# Patient Record
Sex: Female | Born: 1970 | Race: White | Hispanic: No | Marital: Single | Smoking: Never smoker
Health system: Southern US, Community
[De-identification: ages and names within clinical notes are randomized; demographics above are authoritative.]

## PROBLEM LIST (undated history)

## (undated) DIAGNOSIS — I10 Essential (primary) hypertension: Secondary | ICD-10-CM

## (undated) DIAGNOSIS — T148XXA Other injury of unspecified body region, initial encounter: Secondary | ICD-10-CM

## (undated) DIAGNOSIS — E785 Hyperlipidemia, unspecified: Secondary | ICD-10-CM

## (undated) HISTORY — DX: Hyperlipidemia, unspecified: E78.5

## (undated) HISTORY — PX: OTHER SURGICAL HISTORY: SHX169

## (undated) HISTORY — DX: Essential (primary) hypertension: I10

## (undated) HISTORY — DX: Other injury of unspecified body region, initial encounter: T14.8XXA

---

## 2006-01-18 ENCOUNTER — Ambulatory Visit: Payer: Self-pay | Admitting: Internal Medicine

## 2006-01-18 LAB — CONVERTED CEMR LAB
ALT: 14 units/L
Basophils Absolute: 0 10*3/uL
CO2: 20 meq/L
Calcium: 8.4 mg/dL
Chloride: 105 meq/L
Cholesterol: 228 mg/dL
Creatinine, Ser: 0.6 mg/dL
Hemoglobin: 14.1 g/dL
Lymphocytes Relative: 28 %
Monocytes Absolute: 0.3 10*3/uL
Neutro Abs: 3.3 10*3/uL
Pap Smear: NORMAL
RBC: 4.82 M/uL
RDW: 12.6 %
Total Protein: 7.4 g/dL
WBC: 5.1 10*3/uL

## 2007-02-08 ENCOUNTER — Encounter: Payer: Self-pay | Admitting: Internal Medicine

## 2007-02-15 ENCOUNTER — Ambulatory Visit: Payer: Self-pay | Admitting: Internal Medicine

## 2007-02-19 ENCOUNTER — Encounter (INDEPENDENT_AMBULATORY_CARE_PROVIDER_SITE_OTHER): Payer: Self-pay | Admitting: Internal Medicine

## 2009-05-19 ENCOUNTER — Ambulatory Visit: Payer: Self-pay | Admitting: Family Medicine

## 2009-05-19 DIAGNOSIS — E785 Hyperlipidemia, unspecified: Secondary | ICD-10-CM | POA: Insufficient documentation

## 2009-05-19 DIAGNOSIS — I10 Essential (primary) hypertension: Secondary | ICD-10-CM | POA: Insufficient documentation

## 2009-05-19 DIAGNOSIS — E669 Obesity, unspecified: Secondary | ICD-10-CM | POA: Insufficient documentation

## 2009-05-22 ENCOUNTER — Encounter: Payer: Self-pay | Admitting: Family Medicine

## 2009-05-25 ENCOUNTER — Encounter: Payer: Self-pay | Admitting: Family Medicine

## 2009-05-25 LAB — CONVERTED CEMR LAB: Hgb A1c MFr Bld: 5.2 % (ref 4.6–6.1)

## 2009-05-26 LAB — CONVERTED CEMR LAB
Basophils Relative: 0 % (ref 0–1)
CO2: 23 meq/L (ref 19–32)
Calcium: 10.1 mg/dL (ref 8.4–10.5)
Creatinine, Ser: 0.68 mg/dL (ref 0.40–1.20)
Eosinophils Absolute: 0.2 10*3/uL (ref 0.0–0.7)
Glucose, Bld: 107 mg/dL — ABNORMAL HIGH (ref 70–99)
HCT: 44.3 % (ref 36.0–46.0)
Hemoglobin: 14.4 g/dL (ref 12.0–15.0)
LDL Cholesterol: 125 mg/dL — ABNORMAL HIGH (ref 0–99)
MCHC: 32.5 g/dL (ref 30.0–36.0)
MCV: 90.8 fL (ref 78.0–100.0)
Monocytes Absolute: 0.4 10*3/uL (ref 0.1–1.0)
Monocytes Relative: 8 % (ref 3–12)
RBC: 4.88 M/uL (ref 3.87–5.11)
TSH: 1.641 microintl units/mL (ref 0.350–4.500)
Total CHOL/HDL Ratio: 3.1

## 2009-05-29 ENCOUNTER — Encounter: Admission: RE | Admit: 2009-05-29 | Discharge: 2009-05-29 | Payer: Self-pay | Admitting: Family Medicine

## 2009-06-05 ENCOUNTER — Encounter: Admission: RE | Admit: 2009-06-05 | Discharge: 2009-06-05 | Payer: Self-pay | Admitting: Family Medicine

## 2009-07-23 ENCOUNTER — Encounter: Payer: Self-pay | Admitting: Family Medicine

## 2009-07-24 ENCOUNTER — Ambulatory Visit: Payer: Self-pay | Admitting: Family Medicine

## 2009-07-24 ENCOUNTER — Encounter: Payer: Self-pay | Admitting: Family Medicine

## 2009-07-24 ENCOUNTER — Other Ambulatory Visit: Admission: RE | Admit: 2009-07-24 | Discharge: 2009-07-24 | Payer: Self-pay | Admitting: Family Medicine

## 2009-08-11 ENCOUNTER — Encounter: Payer: Self-pay | Admitting: Family Medicine

## 2009-11-19 DIAGNOSIS — R7301 Impaired fasting glucose: Secondary | ICD-10-CM | POA: Insufficient documentation

## 2009-11-19 DIAGNOSIS — R310 Gross hematuria: Secondary | ICD-10-CM | POA: Insufficient documentation

## 2009-11-19 LAB — CONVERTED CEMR LAB
Chloride: 102 meq/L (ref 96–112)
Cholesterol: 192 mg/dL (ref 0–200)
Creatinine, Ser: 0.76 mg/dL (ref 0.40–1.20)
HDL: 54 mg/dL (ref >39)
LDL Cholesterol: 122 mg/dL — ABNORMAL HIGH (ref 0–99)
Potassium: 4.1 meq/L (ref 3.5–5.3)
Triglycerides: 79 mg/dL (ref <150)

## 2009-11-27 ENCOUNTER — Ambulatory Visit: Payer: Self-pay | Admitting: Family Medicine

## 2010-02-25 LAB — CONVERTED CEMR LAB
BUN: 16 mg/dL (ref 6–23)
Chloride: 102 meq/L (ref 96–112)
Creatinine, Ser: 0.77 mg/dL (ref 0.40–1.20)
Glucose, Bld: 98 mg/dL (ref 70–99)
Hgb A1c MFr Bld: 5.5 % (ref <5.7)
LDL Cholesterol: 134 mg/dL — ABNORMAL HIGH (ref 0–99)
Triglycerides: 110 mg/dL (ref <150)
VLDL: 22 mg/dL (ref 0–40)

## 2010-03-03 ENCOUNTER — Ambulatory Visit: Payer: Self-pay | Admitting: Family Medicine

## 2010-03-03 DIAGNOSIS — R5381 Other malaise: Secondary | ICD-10-CM | POA: Insufficient documentation

## 2010-03-03 DIAGNOSIS — R5383 Other fatigue: Secondary | ICD-10-CM

## 2010-03-03 DIAGNOSIS — E559 Vitamin D deficiency, unspecified: Secondary | ICD-10-CM | POA: Insufficient documentation

## 2010-03-04 DIAGNOSIS — R9431 Abnormal electrocardiogram [ECG] [EKG]: Secondary | ICD-10-CM

## 2010-03-04 HISTORY — DX: Abnormal electrocardiogram (ECG) (EKG): R94.31

## 2010-03-05 ENCOUNTER — Encounter: Payer: Self-pay | Admitting: Family Medicine

## 2010-03-09 ENCOUNTER — Ambulatory Visit: Payer: Self-pay | Admitting: Cardiology

## 2010-03-09 ENCOUNTER — Ambulatory Visit (HOSPITAL_COMMUNITY): Admission: RE | Admit: 2010-03-09 | Discharge: 2010-03-09 | Payer: Self-pay | Admitting: Family Medicine

## 2010-03-11 ENCOUNTER — Telehealth (INDEPENDENT_AMBULATORY_CARE_PROVIDER_SITE_OTHER): Payer: Self-pay | Admitting: *Deleted

## 2010-03-16 ENCOUNTER — Telehealth: Payer: Self-pay | Admitting: Family Medicine

## 2010-03-30 ENCOUNTER — Encounter: Payer: Self-pay | Admitting: Family Medicine

## 2010-04-05 ENCOUNTER — Telehealth: Payer: Self-pay | Admitting: Family Medicine

## 2010-06-07 ENCOUNTER — Encounter: Admission: RE | Admit: 2010-06-07 | Discharge: 2010-06-07 | Payer: Self-pay | Admitting: Family Medicine

## 2010-07-23 LAB — CONVERTED CEMR LAB
BUN: 10 mg/dL (ref 6–23)
CO2: 25 meq/L (ref 19–32)
Chloride: 103 meq/L (ref 96–112)
Creatinine, Ser: 0.64 mg/dL (ref 0.40–1.20)
Hemoglobin: 13.9 g/dL (ref 12.0–15.0)
LDL Cholesterol: 140 mg/dL — ABNORMAL HIGH (ref 0–99)
Lymphocytes Relative: 27 % (ref 12–46)
Lymphs Abs: 1.2 10*3/uL (ref 0.7–4.0)
Monocytes Absolute: 0.4 10*3/uL (ref 0.1–1.0)
Monocytes Relative: 8 % (ref 3–12)
Neutro Abs: 2.8 10*3/uL (ref 1.7–7.7)
Neutrophils Relative %: 63 % (ref 43–77)
Potassium: 4 meq/L (ref 3.5–5.3)
RBC: 4.51 M/uL (ref 3.87–5.11)
TSH: 1.98 microintl units/mL (ref 0.350–4.500)
VLDL: 23 mg/dL (ref 0–40)
Vit D, 25-Hydroxy: 41 ng/mL (ref 30–89)
WBC: 4.5 10*3/uL (ref 4.0–10.5)

## 2010-07-27 ENCOUNTER — Ambulatory Visit: Payer: Self-pay | Admitting: Family Medicine

## 2010-07-27 ENCOUNTER — Other Ambulatory Visit: Admission: RE | Admit: 2010-07-27 | Discharge: 2010-07-27 | Payer: Self-pay | Admitting: Family Medicine

## 2010-07-29 ENCOUNTER — Encounter: Payer: Self-pay | Admitting: Family Medicine

## 2010-07-29 LAB — CONVERTED CEMR LAB: Pap Smear: NEGATIVE

## 2010-10-12 NOTE — Letter (Signed)
Summary: Pap Smear, Normal Letter, [facility]  [address]   [place], [place] [address]   Phone: [phone]  Fax: [phone]          July 29, 2010    Dear: [name]    I am pleased to notify you that your PAP smear was normal.  You will need your next PAP smear in:     ____ 3 Months    ____ 6 Months    ____ 12 Months    Please call the office at our office number above, to schedule your next appointment.    Sincerely,    Adella Hare, LPN [facility]

## 2010-10-12 NOTE — Letter (Signed)
Summary: CHRONIC DISEASE REFERRAL  CHRONIC DISEASE REFERRAL   Imported By: Lind Guest 03/05/2010 10:28:04  _____________________________________________________________________  External Attachment:    Type:   Image     Comment:   External Document

## 2010-10-12 NOTE — Progress Notes (Signed)
  Phone Note Call from Patient   Summary of Call: What is the reason for the [facility] nutrition referral? I told them it was to lose weight but they wanted to know if she was having problems with her sugar or had she "gained" weight recently and couldn't get it off? I told her no, but I was assuming you were just wanting it to help her lose weight but she needs a specific diagnosis code to enter.   [facility] [phone] ext [phone] Initial call taken by: Everitt Amber LPN,  April 05, 2010 4:34 PM  Follow-up for Phone Call        overweight  is the dx , to assist with weight loss, maybe one visit only enough, see if pt really wants to go/thinksit necessary before pursuing, offer her a 1200 cal diet sheet also pls Follow-up by: Syliva Overman MD,  April 05, 2010 5:07 PM  Additional Follow-up for Phone Call Additional follow up Details #1::        called patient no answer. Faxed the ICD-9 code to the nutritionist at [facility] Additional Follow-up by: Everitt Amber LPN,  April 06, 2010 4:27 PM

## 2010-10-12 NOTE — Progress Notes (Signed)
  Phone Note Other Incoming   Caller: dr simpson Summary of Call: pls call Carrie Mcintyre and let her know that her test shows no sign of blockage in her heart, It only shows mild enlrgement of her left heart chamber that pumps blood to her body (the ventricle), and this is because she has hypertension. Since it is controlled her heart should not get more eblarged, pls reassure her the study report ois good, continue regular execise with reduced carb /calorie diet to facilitate weight l;oss and improved health Initial call taken by: Syliva Overman MD,  March 16, 2010 8:28 PM  Follow-up for Phone Call        patient aware Follow-up by: Adella Hare LPN,  March 17, 2129 11:25 AM

## 2010-10-12 NOTE — Assessment & Plan Note (Signed)
Summary: office visit   Vital Signs:  Patient profile:   40 year old female Menstrual status:  regular Height:      64.5 inches Weight:      171 pounds BMI:     29.00 O2 Sat:      95 % Pulse rate:   90 / minute Pulse rhythm:   regular Resp:     16 per minute BP sitting:   112 / 84  (left arm) Cuff size:   regular  Vitals Entered By: Everitt Amber LPN (March 03, 2010 8:11 AM)  Nutrition Counseling: Patient's BMI is greater than 25 and therefore counseled on weight management options. CC: Follow up chronic problems   CC:  Follow up chronic problems.  History of Present Illness: Reports  that she has been  doing well.She has totally "bought into" the exercise routine, however her diet is unchanged and unfortunately her weight is essentially unchanged. Denies recent fever or chills. Denies sinus pressure, nasal congestion , ear pain or sore throat. Denies chest congestion, or cough productive of sputum. Denies chest pain, palpitations, PND, orthopnea or leg swelling.She nhas some concerns about the EKG which she had done late last year, on review, there is a question of an old infarct, though she is asy,mptomatic, her mother has peripheral vascular ds, afdter a "curbside consult " with cardiology, she will have an echocardiogram. Denies abdominal pain, nausea, vomitting, diarrhea or constipation. Denies change in bowel movements or bloody stool. Denies dysuria , frequency, incontinence or hesitancy. Denies  joint pain, swelling, or reduced mobility. Denies headaches, vertigo, seizures. Denies depression, anxiety or insomnia. Denies  rash, lesions, or itch.     Current Medications (verified): 1)  Maxzide-25 37.5-25 Mg Tabs (Triamterene-Hctz) .... One and A Half Tablets Daily  Allergies (verified): No Known Drug Allergies  Review of Systems      See HPI Eyes:  Denies blurring and discharge. CV:  Denies chest pain or discomfort, difficulty breathing while lying down, and  swelling of feet. Psych:  Denies anxiety and depression. Endo:  Denies cold intolerance, excessive hunger, excessive thirst, excessive urination, heat intolerance, polyuria, and weight change. Heme:  Denies abnormal bruising and bleeding. Allergy:  Denies hives or rash and itching eyes.  Physical Exam  General:  Well-developed,well-nourished,in no acute distress; alert,appropriate and cooperative throughout examination HEENT: No facial asymmetry,  EOMI, No sinus tenderness, TM's Clear, oropharynx  pink and moist.   Chest: Clear to auscultation bilaterally.  CVS: S1, S2, No murmurs, No S3.   Abd: Soft, Nontender.  MS: Adequate ROM spine, hips, shoulders and knees.  Ext: No edema.   CNS: CN 2-12 intact, power tone and sensation normal throughout.   Skin: Intact, no visible lesions or rashes.  Psych: Good eye contact, normal affect.  Memory intact, not anxious or depressed appearing.    Impression & Recommendations:  Problem # 1:  ABNORMAL ELECTROCARDIOGRAM (ICD-794.31) Assessment Comment Only  Future Orders: Cardiology Referral (Cardiology) ... 03/04/2010  Problem # 2:  HYPERLIPIDEMIA (ICD-272.4) Assessment: Deteriorated  Orders: T-Lipid Profile [id number])  Labs Reviewed: SGOT: 14 (01/18/2006)   SGPT: 14 (01/18/2006)  Prior 10 Yr Risk Heart Disease: 2 % (07/24/2009)   HDL:59 (02/24/2010), 54 (11/18/2009)  LDL:134 (02/24/2010), 122 (11/18/2009)  Chol:215 (02/24/2010), 192 (11/18/2009)  Trig:110 (02/24/2010), 79 (11/18/2009), pt to aggressively work on dietary changes, no meds at this time  Problem # 3:  HYPERTENSION (ICD-401.9) Assessment: Unchanged  Her updated medication list for this problem includes:    Maxzide-25  37.5-25 Mg Tabs (Triamterene-hctz) ..... One and a half tablets daily  Orders: T-Basic Metabolic Panel [id number])  BP today: 112/84 Prior BP: 112/80 (11/27/2009)  Prior 10 Yr Risk Heart Disease: 2 % (07/24/2009)  Labs Reviewed: K+: 3.8  (02/24/2010) Creat: : 0.77 (02/24/2010)   Chol: 215 (02/24/2010)   HDL: 59 (02/24/2010)   LDL: 134 (02/24/2010)   TG: 110 (02/24/2010)  Complete Medication List: 1)  Maxzide-25 37.5-25 Mg Tabs (Triamterene-hctz) .... One and a half tablets daily  Other Orders: T- Hemoglobin A1C ([id number]) T-CBC w/Diff [id number]) T-TSH [id number]) T-Vitamin D (25-Hydroxy) [id number])  Patient Instructions: 1)  Please schedule a CPE november 13 or after 2)  Lipid Panel prior to visit, ICD-9: 3)  HbgA1C prior to visit, ICD-9: 4)  BMP prior to visit, ICD-9: 5)  TSH prior to visit, ICD-9:   fasting in November  6)  CBC w/ Diff prior to visit, ICD-9: 7)  Vit D 8)  It is important that you exercise regularly at least 60 minutes 5 times a week. If you develop chest pain, have severe difficulty breathing, or feel very tired , stop exercising immediately and seek medical attention.Great keep it up 9)  You need to lose weight. Consider a lower calorie diet and regular exercise. Pls focus onn changing your diet, pls keep appt wiith a nutritionist

## 2010-10-12 NOTE — Progress Notes (Signed)
Summary: NUTRITION THERAPHY  NUTRITION THERAPHY   Imported By: Lind Guest 04/06/2010 14:22:45  _____________________________________________________________________  External Attachment:    Type:   Image     Comment:   External Document

## 2010-10-12 NOTE — Assessment & Plan Note (Signed)
Summary: physical   Vital Signs:  Patient profile:   40 year old female Menstrual status:  regular Height:      64.5 inches Weight:      166 pounds BMI:     28.16 O2 Sat:      98 % on Room air Pulse rate:   86 / minute Pulse rhythm:   regular Resp:     16 per minute BP sitting:   122 / 90  (left arm)  Vitals Entered By: Mauricia Area CMA (July 27, 2010 8:14 AM)  Nutrition Counseling: Patient's BMI is greater than 25 and therefore counseled on weight management options.  O2 Flow:  Room air CC: physical  Vision Screening:Left eye w/o correction: 20 / 15 Right Eye w/o correction: 20 / 25 Both eyes w/o correction:  20/ 13        Vision Entered By: Mauricia Area CMA (July 27, 2010 8:23 AM)   Primary Care Provider:  Syliva Overman MD  CC:  physical.  History of Present Illness: Reports  that she has been  doing well. Denies recent fever or chills. Denies sinus pressure, nasal congestion , ear pain or sore throat. Denies chest congestion, or cough productive of sputum. Denies chest pain, palpitations, PND, orthopnea or leg swelling. Denies abdominal pain, nausea, vomitting, diarrhea or constipation. Denies change in bowel movements or bloody stool. Denies dysuria , frequency, incontinence or hesitancy. Denies  joint pain, swelling, or reduced mobility. Denies headaches, vertigo, seizures. Denies depression, anxiety or insomnia. Denies  rash, lesions, or itch.     Current Medications (verified): 1)  Maxzide-25 37.5-25 Mg Tabs (Triamterene-Hctz) .... One and A Half Tablets Daily  Allergies (verified): No Known Drug Allergies  Review of Systems      See HPI General:  Complains of fatigue. Eyes:  Denies blurring, discharge, eye pain, and red eye. Endo:  Denies cold intolerance, excessive hunger, excessive thirst, and excessive urination. Heme:  Denies abnormal bruising and bleeding. Allergy:  Denies hives or rash and itching eyes.  Physical  Exam  General:  Well-developed,well-nourished,in no acute distress; alert,appropriate and cooperative throughout examination Head:  Normocephalic and atraumatic without obvious abnormalities. No apparent alopecia or balding. Eyes:  No corneal or conjunctival inflammation noted. EOMI. Perrla. Funduscopic exam benign, without hemorrhages, exudates or papilledema. Vision grossly normal. Ears:  External ear exam shows no significant lesions or deformities.  Otoscopic examination reveals clear canals, tympanic membranes are intact bilaterally without bulging, retraction, inflammation or discharge. Hearing is grossly normal bilaterally. Nose:  External nasal examination shows no deformity or inflammation. Nasal mucosa are pink and moist without lesions or exudates. Mouth:  Oral mucosa and oropharynx without lesions or exudates.  Teeth in good repair. Neck:  No deformities, masses, or tenderness noted. Chest Wall:  No deformities, masses, or tenderness noted. Breasts:  No mass, nodules, thickening, tenderness, bulging, retraction, inflamation, nipple discharge or skin changes noted.   Lungs:  Normal respiratory effort, chest expands symmetrically. Lungs are clear to auscultation, no crackles or wheezes. Heart:  Normal rate and regular rhythm. S1 and S2 normal without gallop, murmur, click, rub or other extra sounds. Abdomen:  Bowel sounds positive,abdomen soft and non-tender without masses, organomegaly or hernias noted. Rectal:  No external abnormalities noted. Normal sphincter tone. No rectal masses or tenderness. Genitalia:  Normal introitus for age, no external lesions, no vaginal discharge, mucosa pink and moist, no vaginal or cervical lesions, no vaginal atrophy, no friaility or hemorrhage, normal uterus size and position, no  adnexal masses or tenderness Msk:  No deformity or scoliosis noted of thoracic or lumbar spine.   Pulses:  R and L carotid,radial,femoral,dorsalis pedis and posterior tibial  pulses are full and equal bilaterally Extremities:  No clubbing, cyanosis, edema, or deformity noted with normal full range of motion of all joints.   Neurologic:  No cranial nerve deficits noted. Station and gait are normal. Plantar reflexes are down-going bilaterally. DTRs are symmetrical throughout. Sensory, motor and coordinative functions appear intact. Skin:  Intact without suspicious lesions or rashes Cervical Nodes:  No lymphadenopathy noted Axillary Nodes:  No palpable lymphadenopathy Inguinal Nodes:  No significant adenopathy Psych:  Cognition and judgment appear intact. Alert and cooperative with normal attention span and concentration. No apparent delusions, illusions, hallucinations   Impression & Recommendations:  Problem # 1:  OVERWEIGHT (ICD-278.02) Assessment Unchanged  Ht: 64.5 (07/27/2010)   Wt: 166 (07/27/2010)   BMI: 28.16 (07/27/2010) therapeutic lifestyle change discussed and encouraged  Problem # 2:  HYPERTENSION (ICD-401.9) Assessment: Deteriorated  The following medications were removed from the medication list:    Maxzide-25 37.5-25 Mg Tabs (Triamterene-hctz) ..... One and a half tablets daily Her updated medication list for this problem includes:    Maxzide-25 37.5-25 Mg Tabs (Triamterene-hctz) .Marland Kitchen... Take 1 tablet by mouth once a day  Orders: T-Basic Metabolic Panel [id number])  BP today: 122/90 Prior BP: 112/84 (03/03/2010)  Prior 10 Yr Risk Heart Disease: 2 % (07/24/2009)  Labs Reviewed: K+: 4.0 (07/21/2010) Creat: : 0.64 (07/21/2010)   Chol: 233 (07/21/2010)   HDL: 70 (07/21/2010)   LDL: 140 (07/21/2010)   TG: 114 (07/21/2010)  Problem # 3:  HYPERLIPIDEMIA (ICD-272.4) Assessment: Deteriorated  Orders: T-Lipid Profile [id number])  Labs Reviewed: SGOT: 14 (01/18/2006)   SGPT: 14 (01/18/2006)  Prior 10 Yr Risk Heart Disease: 2 % (07/24/2009)   HDL:70 (07/21/2010), 59 (02/24/2010)  LDL:140 (07/21/2010), 134 (02/24/2010)  Chol:233  (07/21/2010), 215 (02/24/2010)  Trig:114 (07/21/2010), 110 (02/24/2010) Low fat dietdiscussed and encouraged  Complete Medication List: 1)  Maxzide-25 37.5-25 Mg Tabs (Triamterene-hctz) .... Take 1 tablet by mouth once a day  Other Orders: T- Hemoglobin A1C ([id number]) Influenza Vaccine NON MCR (57846) Pap Smear (96295)  Patient Instructions: 1)  Follow up appointment in 5.18months 2)  hBA1c and fasting chem 7 and lipid in 4 months 3)  It is important that you exercise regularly at least 20 minutes 5 times a week. If you develop chest pain, have severe difficulty breathing, or feel very tired , stop exercising immediately and seek medical attention. 4)  You need to lose weight. Consider a lower calorie diet and regular exercise.  5)  pls eat more white meat, less cheese and fatty foods 6)  flu vac today Prescriptions: MAXZIDE-25 37.5-25 MG TABS (TRIAMTERENE-HCTZ) Take 1 tablet by mouth once a day  #135 x 3   Entered and Authorized by:   Syliva Overman MD   Signed by:   Syliva Overman MD on 07/27/2010   Method used:   Printed then faxed to ...       [facility]  [address] [phone]* (retail)       [address]       [place], [place]  [address]       Ph: [phone] or [phone]       Fax: [phone]   RxID:   [id number]    Orders Added: 1)  Est. Patient Level IV [10932] 2)  T-Basic Metabolic Panel [id number] 3)  T-Lipid Profile [[id number]] 4)  T- Hemoglobin A1C [[id number]] 5)  Influenza Vaccine NON MCR [00028] 6)  Pap Smear [41324]   Immunizations Administered:  Influenza Vaccine # 1:    Vaccine Type: Fluvax Non-MCR    Site: left deltoid    Mfr: novartis    Dose: 0.5 ml    Route: IM    Given by: Adella Hare LPN    Exp. Date: 01/2011    Lot #: [id number]    VIS given: 04/06/10 version given July 27, 2010.   Immunizations Administered:  Influenza Vaccine # 1:    Vaccine Type: Fluvax Non-MCR    Site: left deltoid    Mfr: novartis     Dose: 0.5 ml    Route: IM    Given by: Adella Hare LPN    Exp. Date: 01/2011    Lot #: [id number]    VIS given: 04/06/10 version given July 27, 2010.

## 2010-10-12 NOTE — Progress Notes (Signed)
Summary: TEST RESULTS  Phone Note Call from Patient   Summary of Call: WANTS TEST RESULTS   CALL  BACK AT   [phone] Initial call taken by: Lind Guest,  March 11, 2010 4:11 PM  Follow-up for Phone Call        let her know we are waitingon word from dr Dietrich Pates pls Follow-up by: Syliva Overman MD,  March 11, 2010 4:55 PM  Additional Follow-up for Phone Call Additional follow up Details #1::        returned call, no answer Additional Follow-up by: Adella Hare LPN,  March 13, 4539 11:06 AM    Additional Follow-up for Phone Call Additional follow up Details #2::    Patient aware but really anxious to know something since its the long holiday weekend Follow-up by: Everitt Amber LPN,  March 12, 9810 1:28 PM

## 2010-10-12 NOTE — Assessment & Plan Note (Signed)
Summary: follow up- room 1   Vital Signs:  Patient profile:   40 year old female Menstrual status:  regular Height:      64.5 inches Weight:      173.75 pounds BMI:     29.47 O2 Sat:      98 % on Room air Pulse rate:   103 / minute Resp:     16 per minute BP sitting:   112 / 80  (left arm)  Vitals Entered By: Adella Hare LPN (November 27, 2009 8:15 AM) CC: follow-up visit Is Patient Diabetic? No Pain Assessment Patient in pain? no        CC:  follow-up visit.  History of Present Illness: Pt is here today for f/u of her HTN and review recent labs. She has been exercising regularly.  walks 1miles/wk & is feeling better overall.  Also has been making dietary changes to try to help with her wt.  She was recently on a cruise & didn't eat healthy or appropritate servings while on vacation. She is taking her BP meds daily.  No side effects or probs with. She denies chest pain, palp, difficulty breathing, or swelling.    Current Medications (verified): 1)  Maxzide-25 37.5-25 Mg Tabs (Triamterene-Hctz) .... One and A Half Tablets Daily  Allergies (verified): No Known Drug Allergies  Past History:  Past medical history reviewed for relevance to current acute and chronic problems.  Past Medical History: Reviewed history from 05/19/2009 and no changes required. fibrocystic breast, painless elevated blood pressure  2008  Review of Systems CV:  Denies chest pain or discomfort, palpitations, and swelling of feet. Resp:  Denies cough and shortness of breath. GI:  Denies abdominal pain, indigestion, nausea, and vomiting. Neuro:  Denies headaches.  Physical Exam  General:  Well-developed,well-nourished,in no acute distress; alert,appropriate and cooperative throughout examination Head:  Normocephalic and atraumatic without obvious abnormalities. No apparent alopecia or balding. Ears:  External ear exam shows no significant lesions or deformities.  Otoscopic examination  reveals clear canals, tympanic membranes are intact bilaterally without bulging, retraction, inflammation or discharge. Hearing is grossly normal bilaterally. Nose:  External nasal examination shows no deformity or inflammation. Nasal mucosa are pink and moist without lesions or exudates. Mouth:  Oral mucosa and oropharynx without lesions or exudates.  Teeth in good repair. Neck:  No deformities, masses, or tenderness noted. Lungs:  Normal respiratory effort, chest expands symmetrically. Lungs are clear to auscultation, no crackles or wheezes. Heart:  Normal rate and regular rhythm. S1 and S2 normal without gallop, murmur, click, rub or other extra sounds. Cervical Nodes:  No lymphadenopathy noted Psych:  Cognition and judgment appear intact. Alert and cooperative with normal attention span and concentration. No apparent delusions, illusions, hallucinations   Impression & Recommendations:  Problem # 1:  HYPERTENSION (ICD-401.9) Assessment Improved  Her updated medication list for this problem includes:    Maxzide-25 37.5-25 Mg Tabs (Triamterene-hctz) ..... One and a half tablets daily  Orders: T-Basic Metabolic Panel [id number])  BP today: 112/80 Prior BP: 146/91 (07/24/2009)  Prior 10 Yr Risk Heart Disease: 2 % (07/24/2009)  Labs Reviewed: K+: 4.1 (11/18/2009) Creat: : 0.76 (11/18/2009)   Chol: 192 (11/18/2009)   HDL: 54 (11/18/2009)   LDL: 122 (11/18/2009)   TG: 79 (11/18/2009)  Problem # 2:  HYPERLIPIDEMIA (ICD-272.4) Assessment: Improved Pt will continue to work on diet, exercise & wt loss. Also suggested fish oil capsules 1 daily.  Orders: T-Lipid Profile [id number])  Labs Reviewed: SGOT: 14 (  01/18/2006)   SGPT: 14 (01/18/2006)  Prior 10 Yr Risk Heart Disease: 2 % (07/24/2009)   HDL:54 (11/18/2009), 70 (05/22/2009)  LDL:122 (11/18/2009), 125 (05/22/2009)  Chol:192 (11/18/2009), 215 (05/22/2009)  Trig:79 (11/18/2009), 102 (05/22/2009)  Problem # 3:  OVERWEIGHT  (ICD-278.02) Assessment: Unchanged Pt was doing well until recent vacation/cruise.  Will continue with current plan & re-eval in 3 mos.  Ht: 64.5 (11/27/2009)   Wt: 173.75 (11/27/2009)   BMI: 29.47 (11/27/2009)  Complete Medication List: 1)  Maxzide-25 37.5-25 Mg Tabs (Triamterene-hctz) .... One and a half tablets daily  Other Orders: T- Hemoglobin A1C ([id number])  Patient Instructions: 1)  Please schedule a follow-up appointment in 3 months. 2)  It is important that you exercise regularly at least 20 minutes 5 times a week. If you develop chest pain, have severe difficulty breathing, or feel very tired , stop exercising immediately and seek medical attention. 3)  You need to lose weight. Consider a lower calorie diet and regular exercise.

## 2011-01-18 ENCOUNTER — Other Ambulatory Visit: Payer: Self-pay | Admitting: Family Medicine

## 2011-01-21 ENCOUNTER — Ambulatory Visit: Payer: Self-pay | Admitting: Family Medicine

## 2011-01-26 ENCOUNTER — Telehealth: Payer: Self-pay | Admitting: Family Medicine

## 2011-01-26 MED ORDER — TRIAMTERENE-HCTZ 37.5-25 MG PO TABS: 1.0000 | ORAL_TABLET | Freq: Every day | ORAL | Status: DC

## 2011-01-26 NOTE — Telephone Encounter (Signed)
I did not deny the med but told her I would send it in

## 2011-02-11 ENCOUNTER — Other Ambulatory Visit: Payer: Self-pay | Admitting: Family Medicine

## 2011-02-11 LAB — HEMOGLOBIN A1C: Hgb A1c MFr Bld: 5.1 % (ref <5.7)

## 2011-02-11 LAB — LIPID PANEL
HDL: 68 mg/dL (ref >39)
LDL Cholesterol: 161 mg/dL — ABNORMAL HIGH (ref 0–99)
Triglycerides: 97 mg/dL (ref <150)

## 2011-02-11 LAB — BASIC METABOLIC PANEL
BUN: 12 mg/dL (ref 6–23)
CO2: 25 mEq/L (ref 19–32)
Calcium: 9.3 mg/dL (ref 8.4–10.5)
Chloride: 103 mEq/L (ref 96–112)
Creat: 0.65 mg/dL (ref 0.50–1.10)

## 2011-02-16 ENCOUNTER — Encounter: Payer: Self-pay | Admitting: Family Medicine

## 2011-02-17 ENCOUNTER — Ambulatory Visit (INDEPENDENT_AMBULATORY_CARE_PROVIDER_SITE_OTHER): Payer: BC Managed Care – PPO | Admitting: Family Medicine

## 2011-02-17 ENCOUNTER — Encounter: Payer: Self-pay | Admitting: Family Medicine

## 2011-02-17 DIAGNOSIS — I10 Essential (primary) hypertension: Secondary | ICD-10-CM

## 2011-02-17 DIAGNOSIS — R5383 Other fatigue: Secondary | ICD-10-CM

## 2011-02-17 DIAGNOSIS — E785 Hyperlipidemia, unspecified: Secondary | ICD-10-CM

## 2011-02-17 DIAGNOSIS — R7301 Impaired fasting glucose: Secondary | ICD-10-CM

## 2011-02-17 DIAGNOSIS — R5381 Other malaise: Secondary | ICD-10-CM

## 2011-02-17 DIAGNOSIS — F4321 Adjustment disorder with depressed mood: Secondary | ICD-10-CM

## 2011-02-17 DIAGNOSIS — Z129 Encounter for screening for malignant neoplasm, site unspecified: Secondary | ICD-10-CM

## 2011-02-17 DIAGNOSIS — E663 Overweight: Secondary | ICD-10-CM

## 2011-02-17 MED ORDER — TRIAMTERENE-HCTZ 37.5-25 MG PO TABS: 1.0000 | ORAL_TABLET | Freq: Every day | ORAL | Status: DC

## 2011-02-17 NOTE — Patient Instructions (Addendum)
CPE in 6 months.  I am extremely sorry to learn of your recent loss, please, call if you decide if you need a referral for therapy , as we discussed.  You are being referred to dermatology.  Fasting labs in 6  Months   Pls try to start a regular exercise routine, and allow yourself time and space to deal with your grief .   Mammogram is due in Sept, pls schedule

## 2011-02-26 ENCOUNTER — Encounter: Payer: Self-pay | Admitting: Family Medicine

## 2011-02-26 DIAGNOSIS — F4321 Adjustment disorder with depressed mood: Secondary | ICD-10-CM | POA: Insufficient documentation

## 2011-02-26 NOTE — Assessment & Plan Note (Signed)
Unchanged hBa1C, encouraged to focus on healthier eating and more exercise in the next 6 months

## 2011-02-26 NOTE — Assessment & Plan Note (Signed)
Controlled, no change in medication  

## 2011-02-26 NOTE — Assessment & Plan Note (Signed)
Deteriorated. Patient re-educated about  the importance of commitment to a  minimum of 150 minutes of exercise per week. The importance of healthy food choices with portion control discussed. Encouraged to start a food diary, count calories and to consider  joining a support group. Sample diet sheets offered. Goals set by the patient for the next several months.    

## 2011-02-26 NOTE — Progress Notes (Signed)
  Subjective:    Patient ID: Carrie Mcintyre, female    DOB: Sep 04, 1971, 40 y.o.   MRN: [id number]  HPI The PT is here for follow up and re-evaluation of chronic medical conditions, medication management and review of recent lab and radiology data.  Preventive health is updated, specifically  Cancer screening,  and Immunization. Of note mother recently died of melanoma , 6 weeks after dx, pt has no specific skin lesions of concern, but is clearly highly motivated for regular derm eval Questions or concerns regarding consultations or procedures which the PT has had in the interim are  addressed. The PT denies any adverse reactions to current medications since the last visit.       Review of Systems Denies recent fever or chills. Denies sinus pressure, nasal congestion, ear pain or sore throat. Denies chest congestion, productive cough or wheezing. Denies chest pains, palpitations, paroxysmal nocturnal dyspnea, orthopnea and leg swelling Denies abdominal pain, nausea, vomiting,diarrhea or constipation.  Denies rectal bleeding or change in bowel movement. Denies dysuria, frequency, hesitancy or incontinence. Denies joint pain, swelling and limitation in mobility. Denies headaches, seizure, numbness, or tingling. C/o excessive crying and depression since the recent loss of her mother Denies skin break down or rash.Denies any visible skin lesions        Objective:   Physical Exam Patient alert and oriented and in no Cardiopulmonary distress.  HEENT: No facial asymmetry, EOMI, no sinus tenderness, TM's clear, Oropharynx pink and moist.  Neck supple no adenopathy.  Chest: Clear to auscultation bilaterally.  CVS: S1, S2 no murmurs, no S3.  ABD: Soft non tender. Bowel sounds normal.  Ext: No edema  MS: Adequate ROM spine, shoulders, hips and knees.  Skin: Intact, no ulcerations or rash noted.  Psych: Good eye contact, . Memory intact , tearful at times and  depressed  appearing.  CNS: CN 2-12 intact, power, tone and sensation normal throughout.        Assessment & Plan:

## 2011-02-26 NOTE — Assessment & Plan Note (Signed)
Unexpected loss of her mother early January after a 6 week course of melanoma, considering therapy, will start with hospice if she decides

## 2011-02-26 NOTE — Assessment & Plan Note (Signed)
Deteriorated, low  Fat diet discussed and encouraged

## 2011-02-26 NOTE — Assessment & Plan Note (Signed)
Deteriorated due to grief

## 2011-04-25 ENCOUNTER — Other Ambulatory Visit: Payer: Self-pay | Admitting: Family Medicine

## 2011-04-25 ENCOUNTER — Telehealth: Payer: Self-pay | Admitting: Family Medicine

## 2011-04-25 DIAGNOSIS — I10 Essential (primary) hypertension: Secondary | ICD-10-CM

## 2011-04-25 MED ORDER — TRIAMTERENE-HCTZ 37.5-25 MG PO CAPS: ORAL_CAPSULE | ORAL | Status: DC

## 2011-04-25 NOTE — Telephone Encounter (Signed)
New dose sent

## 2011-04-25 NOTE — Telephone Encounter (Signed)
Called patient, left message.

## 2011-04-25 NOTE — Telephone Encounter (Signed)
New script written and entered historically only,3 month with 1 refill, pls notify pt and pharmacy and send in, thanks

## 2011-04-25 NOTE — Telephone Encounter (Signed)
i do not see a change on this med

## 2011-06-01 ENCOUNTER — Other Ambulatory Visit: Payer: Self-pay | Admitting: Family Medicine

## 2011-06-01 DIAGNOSIS — Z1231 Encounter for screening mammogram for malignant neoplasm of breast: Secondary | ICD-10-CM

## 2011-06-10 ENCOUNTER — Ambulatory Visit
Admission: RE | Admit: 2011-06-10 | Discharge: 2011-06-10 | Disposition: A | Discharge status: Home / Self Care | Payer: BC Managed Care – PPO | Source: Ambulatory Visit | Attending: Family Medicine | Admitting: Family Medicine

## 2011-06-10 DIAGNOSIS — Z1231 Encounter for screening mammogram for malignant neoplasm of breast: Secondary | ICD-10-CM

## 2011-06-13 ENCOUNTER — Ambulatory Visit: Payer: BC Managed Care – PPO

## 2011-08-16 ENCOUNTER — Other Ambulatory Visit: Payer: Self-pay | Admitting: Family Medicine

## 2011-08-17 ENCOUNTER — Encounter: Payer: Self-pay | Admitting: Family Medicine

## 2011-08-18 LAB — CBC WITH DIFFERENTIAL/PLATELET
Hemoglobin: 13.4 g/dL (ref 12.0–15.0)
Lymphocytes Relative: 34 % (ref 12–46)
Lymphs Abs: 1.3 10*3/uL (ref 0.7–4.0)
MCH: 30.4 pg (ref 26.0–34.0)
Monocytes Relative: 7 % (ref 3–12)
Neutrophils Relative %: 57 % (ref 43–77)
Platelets: 209 10*3/uL (ref 150–400)
RBC: 4.41 MIL/uL (ref 3.87–5.11)
WBC: 3.8 10*3/uL — ABNORMAL LOW (ref 4.0–10.5)

## 2011-08-18 LAB — LIPID PANEL
HDL: 62 mg/dL (ref >39)
LDL Cholesterol: 141 mg/dL — ABNORMAL HIGH (ref 0–99)
Total CHOL/HDL Ratio: 3.7 Ratio
VLDL: 29 mg/dL (ref 0–40)

## 2011-08-18 LAB — BASIC METABOLIC PANEL
CO2: 23 mEq/L (ref 19–32)
Chloride: 102 mEq/L (ref 96–112)
Glucose, Bld: 98 mg/dL (ref 70–99)
Potassium: 3.7 mEq/L (ref 3.5–5.3)
Sodium: 138 mEq/L (ref 135–145)

## 2011-08-18 LAB — TSH: TSH: 2.041 u[IU]/mL (ref 0.350–4.500)

## 2011-08-18 LAB — HEMOGLOBIN A1C: Hgb A1c MFr Bld: 5.2 % (ref <5.7)

## 2011-08-19 ENCOUNTER — Other Ambulatory Visit (HOSPITAL_COMMUNITY)
Admission: RE | Admit: 2011-08-19 | Discharge: 2011-08-19 | Disposition: A | Discharge status: Home / Self Care | Payer: BC Managed Care – PPO | Source: Ambulatory Visit | Attending: Family Medicine | Admitting: Family Medicine

## 2011-08-19 ENCOUNTER — Encounter: Payer: Self-pay | Admitting: Family Medicine

## 2011-08-19 ENCOUNTER — Ambulatory Visit (INDEPENDENT_AMBULATORY_CARE_PROVIDER_SITE_OTHER): Payer: BC Managed Care – PPO | Admitting: Family Medicine

## 2011-08-19 VITALS — BP 110/78 | HR 94 | Resp 18 | Ht 63.0 in | Wt 175.1 lb

## 2011-08-19 DIAGNOSIS — F4321 Adjustment disorder with depressed mood: Secondary | ICD-10-CM

## 2011-08-19 DIAGNOSIS — I10 Essential (primary) hypertension: Secondary | ICD-10-CM

## 2011-08-19 DIAGNOSIS — Z23 Encounter for immunization: Secondary | ICD-10-CM

## 2011-08-19 DIAGNOSIS — E663 Overweight: Secondary | ICD-10-CM

## 2011-08-19 DIAGNOSIS — Z0000 Encounter for general adult medical examination without abnormal findings: Secondary | ICD-10-CM

## 2011-08-19 DIAGNOSIS — Z01419 Encounter for gynecological examination (general) (routine) without abnormal findings: Secondary | ICD-10-CM | POA: Insufficient documentation

## 2011-08-19 DIAGNOSIS — Z124 Encounter for screening for malignant neoplasm of cervix: Secondary | ICD-10-CM

## 2011-08-19 DIAGNOSIS — E785 Hyperlipidemia, unspecified: Secondary | ICD-10-CM

## 2011-08-19 DIAGNOSIS — F4320 Adjustment disorder, unspecified: Secondary | ICD-10-CM

## 2011-08-19 NOTE — Progress Notes (Signed)
  Subjective:    Patient ID: Carrie Mcintyre, female    DOB: 11-17-1970, 40 y.o.   MRN: [id number]  HPI The PT is here for annual exam  and re-evaluation of chronic medical conditions, medication management and review of any available recent lab and radiology data.  Preventive health is updated, specifically  Cancer screening and Immunization.   Still experiencing a lot of grief and sorrow over untimely, unexpected death of her mother to cancer approx 1 year ago. Has not had therapy, attended 1 grief session, I bel;ieve she wuill benefit and encourage her to reconsider. She will get a book on grief top borrow. No regular exercise, and unable to focus on diet      Review of Systems    See HPI Denies recent fever or chills. Denies sinus pressure, nasal congestion, ear pain or sore throat. Denies chest congestion, productive cough or wheezing. Denies chest pains, palpitations and leg swelling Denies abdominal pain, nausea, vomiting,diarrhea or constipation.   Denies dysuria, frequency, hesitancy or incontinence. Denies joint pain, swelling and limitation in mobility. Denies headaches, seizures, numbness, or tingling. Easily tearful, anxious and visibly upset with tachycardia when she thinks about her Mom Denies skin break down or rash.     Objective:   Physical Exam Pleasant well nourished female, alert and oriented x 3, in no cardio-pulmonary distress. Afebrile. HEENT No facial trauma or asymetry. Sinuses non tender.  EOMI, PERTL, fundoscopic exam is normal, no hemorhage or exudate.  External ears normal, tympanic membranes clear. Oropharynx moist, no exudate, good dentition. Neck: supple, no adenopathy,JVD or thyromegaly.No bruits.  Chest: Clear to ascultation bilaterally.No crackles or wheezes. Non tender to palpation  Breast: No asymetry,no masses. No nipple discharge or inversion. No axillary or supraclavicular adenopathy  Cardiovascular system; Heart sounds normal,   S1 and  S2 ,no S3.  No murmur, or thrill. Apical beat not displaced Peripheral pulses normal.  Abdomen: Soft, non tender, no organomegaly or masses. No bruits. Bowel sounds normal. No guarding, tenderness or rebound.  Rectal:  Pt refused, will bring stool cards  GU: External genitalia normal. No lesions. Vaginal canal normal.No discharge. Uterus normal size, no adnexal masses, no cervical motion or adnexal tenderness.  Musculoskeletal exam: Full ROM of spine, hips , shoulders and knees. No deformity ,swelling or crepitus noted. No muscle wasting or atrophy.   Neurologic: Cranial nerves 2 to 12 intact. Power, tone ,sensation and reflexes normal throughout. No disturbance in gait. No tremor.  Skin: Intact, no ulceration, erythema , scaling or rash noted. Multiple hyperpigmented macular lesions on chest and upper ext, freckled  Psych; Normal mood tearful at times. Judgement and concentration normal        Assessment & Plan:

## 2011-08-19 NOTE — Assessment & Plan Note (Signed)
Very little progress has been made, I believe pt needs individual therapy and recommend this, she will consider

## 2011-08-19 NOTE — Patient Instructions (Addendum)
F/u in 6 months.  Blood pressure is excellent, and lab work has improved  It is important that you exercise regularly at least 30 minutes 5 times a week. If you develop chest pain, have severe difficulty breathing, or feel very tired, stop exercising immediately and seek medical attention    "My fitness pal" is a free computer/internet app which helps with calorie counting, I recommend you "check it out   CBC,vitamin D, lipid,fasting and HBA1C in 6 month  Pls return stool cards, follow the directions provided  Flu vaccine today

## 2011-08-19 NOTE — Progress Notes (Signed)
Addended by: Abner Greenspan on: 08/19/2011 11:01 AM   Modules accepted: Orders

## 2011-08-19 NOTE — Assessment & Plan Note (Signed)
Deteriorated. Patient re-educated about  the importance of commitment to a  minimum of 150 minutes of exercise per week. The importance of healthy food choices with portion control discussed. Encouraged to start a food diary, count calories and to consider  joining a support group. Sample diet sheets offered. Goals set by the patient for the next several months.    

## 2011-08-19 NOTE — Assessment & Plan Note (Signed)
Improved, low fat diet encouraged

## 2011-08-19 NOTE — Assessment & Plan Note (Signed)
Controlled, no change in medication  

## 2011-08-29 ENCOUNTER — Other Ambulatory Visit: Payer: Self-pay

## 2011-08-29 DIAGNOSIS — M25511 Pain in right shoulder: Secondary | ICD-10-CM

## 2011-08-29 DIAGNOSIS — M79601 Pain in right arm: Secondary | ICD-10-CM

## 2011-08-29 DIAGNOSIS — W19XXXA Unspecified fall, initial encounter: Secondary | ICD-10-CM

## 2011-10-10 ENCOUNTER — Other Ambulatory Visit: Payer: Self-pay | Admitting: Family Medicine

## 2012-02-17 ENCOUNTER — Ambulatory Visit: Payer: BC Managed Care – PPO | Admitting: Family Medicine

## 2012-02-24 LAB — CBC
MCH: 30 pg (ref 26.0–34.0)
MCHC: 34.1 g/dL (ref 30.0–36.0)
MCV: 87.9 fL (ref 78.0–100.0)
Platelets: 215 10*3/uL (ref 150–400)
RBC: 4.37 MIL/uL (ref 3.87–5.11)

## 2012-02-24 LAB — LIPID PANEL
HDL: 63 mg/dL (ref >39)
LDL Cholesterol: 175 mg/dL — ABNORMAL HIGH (ref 0–99)

## 2012-02-24 LAB — HEMOGLOBIN A1C
Hgb A1c MFr Bld: 5.2 % (ref <5.7)
Mean Plasma Glucose: 103 mg/dL (ref <117)

## 2012-03-02 ENCOUNTER — Encounter: Payer: Self-pay | Admitting: Family Medicine

## 2012-03-02 ENCOUNTER — Ambulatory Visit (INDEPENDENT_AMBULATORY_CARE_PROVIDER_SITE_OTHER): Payer: BC Managed Care – PPO | Admitting: Family Medicine

## 2012-03-02 VITALS — BP 140/90 | HR 93 | Resp 16 | Ht 63.0 in | Wt 177.0 lb

## 2012-03-02 DIAGNOSIS — R7301 Impaired fasting glucose: Secondary | ICD-10-CM

## 2012-03-02 DIAGNOSIS — Z808 Family history of malignant neoplasm of other organs or systems: Secondary | ICD-10-CM

## 2012-03-02 DIAGNOSIS — R5383 Other fatigue: Secondary | ICD-10-CM

## 2012-03-02 DIAGNOSIS — E785 Hyperlipidemia, unspecified: Secondary | ICD-10-CM

## 2012-03-02 DIAGNOSIS — I10 Essential (primary) hypertension: Secondary | ICD-10-CM

## 2012-03-02 DIAGNOSIS — R5381 Other malaise: Secondary | ICD-10-CM

## 2012-03-02 DIAGNOSIS — F4320 Adjustment disorder, unspecified: Secondary | ICD-10-CM

## 2012-03-02 DIAGNOSIS — E559 Vitamin D deficiency, unspecified: Secondary | ICD-10-CM

## 2012-03-02 DIAGNOSIS — F4321 Adjustment disorder with depressed mood: Secondary | ICD-10-CM

## 2012-03-02 HISTORY — DX: Family history of malignant neoplasm of other organs or systems: Z80.8

## 2012-03-02 MED ORDER — TRIAMTERENE-HCTZ 37.5-25 MG PO TABS: ORAL_TABLET | ORAL | Status: DC

## 2012-03-02 NOTE — Assessment & Plan Note (Signed)
ma

## 2012-03-02 NOTE — Assessment & Plan Note (Signed)
Deteriorated, dietary indiscretion

## 2012-03-02 NOTE — Patient Instructions (Addendum)
cPE in mid December  I am thankful that you are doing better.  It is important that you exercise regularly at least 45 minutes 7 times a week. If you develop chest pain, have severe difficulty breathing, or feel very tired, stop exercising immediately and seek medical attention    A healthy diet is rich in fruit, vegetables and whole grains. Poultry fish, nuts and beans are a healthy choice for protein rather then red meat. A low sodium diet and drinking 64 ounces of water daily is generally recommended. Oils and sweet should be limited. Carbohydrates especially for those who are diabetic or overweight, should be limited to 30-45 gram per meal. It is important to eat on a regular schedule, at least 3 times daily. Snacks should be primarily fruits, vegetables or nuts.  Your blood pressure and cholesterol are too high, please stop the pizza, and eat mainly fruit and vegetable and dietary changes that we spoke about  Fasting lipid, chem 7  And  tSH  In December  10 or after  Referral to dermatology

## 2012-03-02 NOTE — Assessment & Plan Note (Signed)
Corrected, encouraged pt to take MVI with D one daily

## 2012-03-02 NOTE — Assessment & Plan Note (Signed)
Uncontrolled, has been eating an excessive amt of pizza which has a lot of salt and cheese, also not exercising will address these issues, no dose increase in med at this time

## 2012-03-02 NOTE — Progress Notes (Signed)
  Subjective:    Patient ID: Carrie Mcintyre, female    DOB: 1971/01/20, 41 y.o.   MRN: [id number]  HPI The PT is here for follow up and re-evaluation of chronic medical conditions, medication management and review of any available recent lab and radiology data.  Preventive health is updated, specifically  Cancer screening and Immunization.   Questions or concerns regarding consultations or procedures which the PT has had in the interim are  addressed. The PT denies any adverse reactions to current medications since the last visit.  Has slowly been improving mentally from her acute grief reaction and unexpected loss of her mom. Has not been diligent with diet and exercise, and actually wore a sling on her right shoulder between December and March when she had fractured the shoulder, treated with therapy and immobilization, now healed 100% Requests dermatology referral as f/h of melanoma     Review of Systems See HPI Denies recent fever or chills. Denies sinus pressure, nasal congestion, ear pain or sore throat. Denies chest congestion, productive cough or wheezing. Denies chest pains, palpitations and leg swelling Denies abdominal pain, nausea, vomiting,diarrhea or constipation.   Denies dysuria, frequency, hesitancy or incontinence. Denies joint pain, swelling and limitation in mobility. Denies headaches, seizures, numbness, or tingling. Denies depression, anxiety or insomnia. Denies skin break down or rash.        Objective:   Physical Exam Patient alert and oriented and in no cardiopulmonary distress.  HEENT: No facial asymmetry, EOMI, no sinus tenderness,  oropharynx pink and moist.  Neck supple no adenopathy.  Chest: Clear to auscultation bilaterally.  CVS: S1, S2 no murmurs, no S3.  ABD: Soft non tender. Bowel sounds normal.  Ext: No edema  MS: Adequate ROM spine, shoulders, hips and knees.  Skin: Intact, no ulcerations or rash noted.  Psych: Good eye contact,  normal affect. Memory intact not anxious or depressed appearing.  CNS: CN 2-12 intact, power, tone and sensation normal throughout.        Assessment & Plan:

## 2012-05-09 ENCOUNTER — Other Ambulatory Visit: Payer: Self-pay | Admitting: Family Medicine

## 2012-05-09 DIAGNOSIS — Z1231 Encounter for screening mammogram for malignant neoplasm of breast: Secondary | ICD-10-CM

## 2012-06-28 ENCOUNTER — Ambulatory Visit
Admission: RE | Admit: 2012-06-28 | Discharge: 2012-06-28 | Disposition: A | Discharge status: Home / Self Care | Payer: BC Managed Care – PPO | Source: Ambulatory Visit | Attending: Family Medicine | Admitting: Family Medicine

## 2012-06-28 DIAGNOSIS — Z1231 Encounter for screening mammogram for malignant neoplasm of breast: Secondary | ICD-10-CM

## 2012-08-21 ENCOUNTER — Other Ambulatory Visit: Payer: Self-pay | Admitting: Family Medicine

## 2012-08-22 LAB — BASIC METABOLIC PANEL
BUN: 12 mg/dL (ref 6–23)
Calcium: 8.5 mg/dL (ref 8.4–10.5)
Creat: 0.59 mg/dL (ref 0.50–1.10)
Glucose, Bld: 91 mg/dL (ref 70–99)
Sodium: 144 mEq/L (ref 135–145)

## 2012-08-22 LAB — LIPID PANEL
Cholesterol: 158 mg/dL (ref 0–200)
Total CHOL/HDL Ratio: 3.7 Ratio

## 2012-08-24 ENCOUNTER — Ambulatory Visit (INDEPENDENT_AMBULATORY_CARE_PROVIDER_SITE_OTHER): Payer: BC Managed Care – PPO | Admitting: Family Medicine

## 2012-08-24 ENCOUNTER — Encounter: Payer: Self-pay | Admitting: Family Medicine

## 2012-08-24 VITALS — BP 130/88 | HR 80 | Resp 15 | Ht 63.0 in | Wt 173.0 lb

## 2012-08-24 DIAGNOSIS — E663 Overweight: Secondary | ICD-10-CM

## 2012-08-24 DIAGNOSIS — E785 Hyperlipidemia, unspecified: Secondary | ICD-10-CM

## 2012-08-24 DIAGNOSIS — R5383 Other fatigue: Secondary | ICD-10-CM

## 2012-08-24 DIAGNOSIS — R5381 Other malaise: Secondary | ICD-10-CM

## 2012-08-24 DIAGNOSIS — I10 Essential (primary) hypertension: Secondary | ICD-10-CM

## 2012-08-24 MED ORDER — TRIAMTERENE-HCTZ 37.5-25 MG PO TABS: ORAL_TABLET | ORAL | Status: DC

## 2012-08-24 NOTE — Patient Instructions (Addendum)
F/u in 6 month  Fasting CBC, chem 7, lipid and TSH prior to visit  Please return 3 stool cards for testing for hidden blood as a colon cancer screen   CONGRATS on vast improvement to NORMALIZATION of cholesterol, EXCELLENT!!   Weight loss goal of 6 to 10 pounds with lifestyle change. This will also improve blood pressure

## 2012-08-24 NOTE — Assessment & Plan Note (Signed)
Sub optimal control, no med change,  DASH diet and commitment to daily physical activity for a minimum of 30 minutes discussed and encouraged, as a part of hypertension management. The importance of attaining a healthy weight is also discussed.

## 2012-08-24 NOTE — Progress Notes (Signed)
  Subjective:    Patient ID: Carrie Mcintyre, female    DOB: March 12, 1971, 41 y.o.   MRN: [id number]  HPI The PT is here for follow up and re-evaluation of chronic medical conditions, medication management and review of any available recent lab and radiology data. Initially was a CPE, but heavy bleeding and also difficult exam changed visit, pt to return stool cards, pap can be deferred for 2 more years also Preventive health is updated, specifically  Cancer screening and Immunization.   Questions or concerns regarding consultations or procedures which the PT has had in the interim are  Addressed.dermatology has evaluated, f/h aggressive melanoma The PT denies any adverse reactions to current medications since the last visit.  There are no new concerns.Has changed diet, cut out the paizza ever night and cholesterol is pefect, without meds  There are no specific complaints , still grieves for Mm esp at holiday, but doing better      Review of Systems See HPI Denies recent fever or chills. Denies sinus pressure, nasal congestion, ear pain or sore throat. Denies chest congestion, productive cough or wheezing. Denies chest pains, palpitations and leg swelling Denies abdominal pain, nausea, vomiting,diarrhea or constipation.   Denies dysuria, frequency, hesitancy or incontinence. Denies joint pain, swelling and limitation in mobility. Denies headaches, seizures, numbness, or tingling. Denies uncontrolled  depression, anxiety or insomnia. Denies skin break down or rash.        Objective:   Physical Exam Patient alert and oriented and in no cardiopulmonary distress.  HEENT: No facial asymmetry, EOMI, no sinus tenderness,  oropharynx pink and moist.  Neck supple no adenopathy.  Chest: Clear to auscultation bilaterally. Breast: no mass, nipple discharge, nipple inversion, supraclavicular or axillary nodes CVS: S1, S2 no murmurs, no S3.  ABD: Soft non tender. Bowel sounds  normal. Genital: external normal, vaginal canal, excessive amt of blood unable to visualize cervix or do pap. Size of womb normal, no adnexal masses palpated Ext: No edema  MS: Adequate ROM spine, shoulders, hips and knees.  Skin: Intact, no ulcerations or rash noted.  Psych: Good eye contact, normal affect. Memory intact not anxious or depressed appearing.Tearful speaking of her Mom  CNS: CN 2-12 intact, power, tone and sensation normal throughout.        Assessment & Plan:

## 2012-08-25 NOTE — Assessment & Plan Note (Signed)
Slight improvement, but with increased commitment to calorie control and exercise this will improve even more and it needs to

## 2012-08-25 NOTE — Assessment & Plan Note (Signed)
Normalized with change in diet only, pt applauded on this

## 2012-10-27 ENCOUNTER — Other Ambulatory Visit: Payer: Self-pay

## 2013-02-19 LAB — BASIC METABOLIC PANEL WITH GFR
BUN: 16 mg/dL (ref 6–23)
CO2: 26 meq/L (ref 19–32)
Calcium: 9.3 mg/dL (ref 8.4–10.5)
Chloride: 98 meq/L (ref 96–112)
Creat: 0.68 mg/dL (ref 0.50–1.10)
Glucose, Bld: 105 mg/dL — ABNORMAL HIGH (ref 70–99)
Potassium: 3.6 meq/L (ref 3.5–5.3)
Sodium: 133 meq/L — ABNORMAL LOW (ref 135–145)

## 2013-02-19 LAB — TSH: TSH: 2.81 u[IU]/mL (ref 0.350–4.500)

## 2013-02-19 LAB — CBC WITH DIFFERENTIAL/PLATELET
Eosinophils Absolute: 0.2 10*3/uL (ref 0.0–0.7)
Eosinophils Relative: 5 % (ref 0–5)
Lymphs Abs: 1.1 10*3/uL (ref 0.7–4.0)
MCH: 30.2 pg (ref 26.0–34.0)
MCHC: 35.2 g/dL (ref 30.0–36.0)
MCV: 85.7 fL (ref 78.0–100.0)
Platelets: 227 10*3/uL (ref 150–400)
RBC: 4.61 MIL/uL (ref 3.87–5.11)

## 2013-02-19 LAB — LIPID PANEL
Total CHOL/HDL Ratio: 3.8 Ratio
VLDL: 47 mg/dL — ABNORMAL HIGH (ref 0–40)

## 2013-02-22 ENCOUNTER — Encounter: Payer: Self-pay | Admitting: Family Medicine

## 2013-02-22 ENCOUNTER — Ambulatory Visit (INDEPENDENT_AMBULATORY_CARE_PROVIDER_SITE_OTHER): Payer: BC Managed Care – PPO | Admitting: Family Medicine

## 2013-02-22 VITALS — BP 132/92 | HR 89 | Resp 16 | Ht 63.0 in | Wt 172.0 lb

## 2013-02-22 DIAGNOSIS — Z808 Family history of malignant neoplasm of other organs or systems: Secondary | ICD-10-CM

## 2013-02-22 DIAGNOSIS — E8881 Metabolic syndrome: Secondary | ICD-10-CM

## 2013-02-22 DIAGNOSIS — E559 Vitamin D deficiency, unspecified: Secondary | ICD-10-CM

## 2013-02-22 DIAGNOSIS — E669 Obesity, unspecified: Secondary | ICD-10-CM

## 2013-02-22 DIAGNOSIS — E785 Hyperlipidemia, unspecified: Secondary | ICD-10-CM

## 2013-02-22 DIAGNOSIS — I10 Essential (primary) hypertension: Secondary | ICD-10-CM

## 2013-02-22 NOTE — Progress Notes (Signed)
  Subjective:    Patient ID: Carrie Mcintyre, female    DOB: [birth year], 41 y.o.   MRN: [id number]  HPI The PT is here for follow up and re-evaluation of chronic medical conditions, medication management and review of any available recent lab and radiology data.  Preventive health is updated, specifically  Cancer screening and Immunization.    The PT denies any adverse reactions to current medications since the last visit.  There are no new concerns. She had started walking in May but family illness got in the way, her diet has been poor, eating excessive amt of cheese again with uncontrolled lipids, states she will work on this There are no specific complaints       Review of Systems See HPI Denies recent fever or chills. Denies sinus pressure, nasal congestion, ear pain or sore throat. Denies chest congestion, productive cough or wheezing. Denies chest pains, palpitations and leg swelling Denies abdominal pain, nausea, vomiting,diarrhea or constipation.   Denies dysuria, frequency, hesitancy or incontinence. Denies joint pain, swelling and limitation in mobility. Denies headaches, seizures, numbness, or tingling. Denies depression, anxiety or insomnia. Denies skin break down or rash.No skin lesions of concern noted by pt, but will schedule her annual derm eva;l per routine        Objective:   Physical Exam  Patient alert and oriented and in no cardiopulmonary distress.  HEENT: No facial asymmetry, EOMI, no sinus tenderness,  oropharynx pink and moist.  Neck supple no adenopathy.  Chest: Clear to auscultation bilaterally.  CVS: S1, S2 no murmurs, no S3.  ABD: Soft non tender. Bowel sounds normal.  Ext: No edema  MS: Adequate ROM spine, shoulders, hips and knees.  Skin: Intact, no ulcerations or rash noted.  Psych: Good eye contact, normal affect. Memory intact not anxious or depressed appearing.  CNS: CN 2-12 intact, power, tone and sensation normal  throughout.       Assessment & Plan:

## 2013-02-22 NOTE — Patient Instructions (Addendum)
F/u with rectal in early January  Call if you need me before  Commit to physical activity daily at least 30 minutesd  Please leave cheese, focus on colored foods, fresh or frozen   Please start OTC vit D3 800IU daily  No med change,however blood pressure needs to improve  Weight loss goal of 5 pounds per you, but 8 pounds per me  Fasting lipid, chem 7 ,  vit D, hBA1C  Call for flu vaccine

## 2013-02-23 DIAGNOSIS — E8881 Metabolic syndrome: Secondary | ICD-10-CM | POA: Insufficient documentation

## 2013-02-23 HISTORY — DX: Metabolic syndrome: E88.81

## 2013-02-23 HISTORY — DX: Metabolic syndrome: E88.810

## 2013-02-23 NOTE — Assessment & Plan Note (Signed)
Unchanged. Patient re-educated about  the importance of commitment to a  minimum of 150 minutes of exercise per week. The importance of healthy food choices with portion control discussed. Encouraged to start a food diary, count calories and to consider  joining a support group. Sample diet sheets offered. Goals set by the patient for the next several months.    

## 2013-02-23 NOTE — Assessment & Plan Note (Signed)
Sub optimal control No med change. Pt needs to work on diet and exercise and weight loss, also reduce sodium intake

## 2013-02-23 NOTE — Assessment & Plan Note (Signed)
Pt is unaware of any skin lesions and schedules her annual dermatology evaluation , she will do so per routine

## 2013-02-23 NOTE — Assessment & Plan Note (Signed)
Emphisi on lifestyle change to address this so that risk of CvD is reduced

## 2013-02-23 NOTE — Assessment & Plan Note (Signed)
Deteriorated. Has managed this problem in the past with dietary change only, and wishes to continue to do so Will f/u in 6 month CV risk stressed with high cholesterol, pt understands

## 2013-05-01 ENCOUNTER — Encounter: Payer: Self-pay | Admitting: Family Medicine

## 2013-05-01 ENCOUNTER — Ambulatory Visit (INDEPENDENT_AMBULATORY_CARE_PROVIDER_SITE_OTHER): Payer: BC Managed Care – PPO | Admitting: Family Medicine

## 2013-05-01 VITALS — BP 120/82 | HR 104 | Temp 98.9°F | Resp 16 | Ht 63.0 in | Wt 176.1 lb

## 2013-05-01 DIAGNOSIS — J029 Acute pharyngitis, unspecified: Secondary | ICD-10-CM

## 2013-05-01 DIAGNOSIS — H6691 Otitis media, unspecified, right ear: Secondary | ICD-10-CM

## 2013-05-01 DIAGNOSIS — H6690 Otitis media, unspecified, unspecified ear: Secondary | ICD-10-CM

## 2013-05-01 DIAGNOSIS — I10 Essential (primary) hypertension: Secondary | ICD-10-CM

## 2013-05-01 LAB — POCT RAPID STREP A (OFFICE): Rapid Strep A Screen: NEGATIVE

## 2013-05-01 MED ORDER — PENICILLIN V POTASSIUM 500 MG PO TABS: 500.0000 mg | ORAL_TABLET | Freq: Three times a day (TID) | ORAL | Status: DC

## 2013-05-01 NOTE — Patient Instructions (Addendum)
F/u as before   You are being treated for acute pharyngitis and bilateral otitis media.Call if you worsen  Take entire course of antibiotic prescribed , voice rest and gargle.  Rapid strep test is negative , but you are prescribed a 10 day course of penicillin, pls take entire course. Sudafed one twice daily will relieve ear pressure for 3 days, and tylenol 325mg  one twice daily as needed for pain

## 2013-05-05 DIAGNOSIS — H673 Otitis media in diseases classified elsewhere, bilateral: Secondary | ICD-10-CM | POA: Insufficient documentation

## 2013-05-05 NOTE — Assessment & Plan Note (Signed)
antibiotoic course for 10 days and decongestant and tylenol for pain

## 2013-05-05 NOTE — Progress Notes (Signed)
  Subjective:    Patient ID: Carrie Mcintyre, female    DOB: Sep 29, 1970, 42 y.o.   MRN: [id number]  HPI 3 day h/o acute sore throat with fever, denies sinus pressure or drainage, no productive cough or chest congestion.  Also c/o increasing bilateral ear pain and pressure, denies hearing loss or drainage   Review of Systems See HPI  Denies abdominal pain, nausea, vomiting,diarrhea or constipation.   Denies dysuria, frequency, hesitancy or incontinence. Denies joint pain, swelling and limitation in mobility. Denies headaches, Denies depression, anxiety or insomnia. Denies skin break down or rash.        Objective:   Physical Exam  Patient alert and oriented and in no cardiopulmonary distress.Ill appearing  HEENT: No facial asymmetry, EOMI, no sinus tenderness,  oropharynx erythematous , with exudate in right oropharynx.  Neck supple right anterior cervical adenitis.TM erythematous and dull bilaterally, right worse than left  Chest: Clear to auscultation bilaterally.  CVS: S1, S2 no murmurs, no S3.  ABD: Soft non tender. Ext: No edema  MS: Adequate ROM spine, shoulders, hips and knees.  Skin: Intact, no ulcerations or rash noted.         Assessment & Plan:

## 2013-05-05 NOTE — Assessment & Plan Note (Signed)
Rapid strep is negative but based on history and exam, exudate in right posterior oropharynx, will treat presumptively Will not require 24 hr isolation

## 2013-05-05 NOTE — Assessment & Plan Note (Signed)
Controlled, no change in medication DASH diet and commitment to daily physical activity for a minimum of 30 minutes discussed and encouraged, as a part of hypertension management. The importance of attaining a healthy weight is also discussed.  

## 2013-05-06 ENCOUNTER — Telehealth: Payer: Self-pay | Admitting: Family Medicine

## 2013-05-06 NOTE — Telephone Encounter (Signed)
Please advise 

## 2013-05-07 NOTE — Telephone Encounter (Signed)
I spoke directly with the Carrie Mcintyre. She is to start claritin or zyrtec once daily , continue sudafed once daily. If astill symptomatic , she is to call back on 9/27 so I can refer her to ENT, she is aware that I will be out of the office after 06/09/2013 until early September

## 2013-05-08 ENCOUNTER — Other Ambulatory Visit: Payer: Self-pay | Admitting: Family Medicine

## 2013-05-08 ENCOUNTER — Telehealth: Payer: Self-pay | Admitting: Family Medicine

## 2013-05-08 DIAGNOSIS — H9202 Otalgia, left ear: Secondary | ICD-10-CM

## 2013-05-08 NOTE — Telephone Encounter (Signed)
Still having the pressure in her ears, especially the left ear. Has been taking an OTC decongestant and it seems to be helping some but she has been taking it for 3 days and that's all it says to take on the box. Still has a few days on her antibiotic. Do you recommend she wait until she is done with her meds or is there something else you recommend? [phone]

## 2013-05-08 NOTE — Telephone Encounter (Signed)
Patient aware.

## 2013-05-08 NOTE — Telephone Encounter (Signed)
As I discussed with her 2 days ago, she needs ENT to eval the ear I am referring her pls let her know, also put her to referral staff to get the appt, I spoke with Luann already she MAY have the appt

## 2013-05-09 NOTE — Telephone Encounter (Signed)
Patient stated that she is going to make her appointment herself because of working hours

## 2013-06-03 ENCOUNTER — Other Ambulatory Visit: Payer: Self-pay

## 2013-06-03 DIAGNOSIS — Z1231 Encounter for screening mammogram for malignant neoplasm of breast: Secondary | ICD-10-CM

## 2013-06-27 ENCOUNTER — Ambulatory Visit (INDEPENDENT_AMBULATORY_CARE_PROVIDER_SITE_OTHER): Payer: BC Managed Care – PPO | Admitting: Otolaryngology

## 2013-06-27 DIAGNOSIS — H6520 Chronic serous otitis media, unspecified ear: Secondary | ICD-10-CM

## 2013-06-27 DIAGNOSIS — H6980 Other specified disorders of Eustachian tube, unspecified ear: Secondary | ICD-10-CM

## 2013-07-05 ENCOUNTER — Ambulatory Visit
Admission: RE | Admit: 2013-07-05 | Discharge: 2013-07-05 | Disposition: A | Discharge status: Home / Self Care | Payer: BC Managed Care – PPO | Source: Ambulatory Visit

## 2013-07-05 DIAGNOSIS — Z1231 Encounter for screening mammogram for malignant neoplasm of breast: Secondary | ICD-10-CM

## 2013-07-18 ENCOUNTER — Other Ambulatory Visit: Payer: Self-pay | Admitting: Family Medicine

## 2013-07-18 ENCOUNTER — Other Ambulatory Visit: Payer: Self-pay

## 2013-09-13 ENCOUNTER — Ambulatory Visit: Payer: BC Managed Care – PPO | Admitting: Family Medicine

## 2013-09-19 ENCOUNTER — Other Ambulatory Visit: Payer: Self-pay | Admitting: Family Medicine

## 2013-09-19 LAB — LIPID PANEL
Cholesterol: 241 mg/dL — ABNORMAL HIGH (ref 0–200)
HDL: 65 mg/dL (ref >39)
LDL Cholesterol: 143 mg/dL — ABNORMAL HIGH (ref 0–99)
Total CHOL/HDL Ratio: 3.7 Ratio
Triglycerides: 164 mg/dL — ABNORMAL HIGH (ref <150)
VLDL: 33 mg/dL (ref 0–40)

## 2013-09-19 LAB — BASIC METABOLIC PANEL
BUN: 12 mg/dL (ref 6–23)
CALCIUM: 9.1 mg/dL (ref 8.4–10.5)
CO2: 27 meq/L (ref 19–32)
CREATININE: 0.55 mg/dL (ref 0.50–1.10)
Chloride: 99 mEq/L (ref 96–112)
Glucose, Bld: 102 mg/dL — ABNORMAL HIGH (ref 70–99)
Potassium: 3.9 mEq/L (ref 3.5–5.3)
Sodium: 137 mEq/L (ref 135–145)

## 2013-09-19 LAB — HEMOGLOBIN A1C
HEMOGLOBIN A1C: 5.3 % (ref <5.7)
MEAN PLASMA GLUCOSE: 105 mg/dL (ref <117)

## 2013-09-20 ENCOUNTER — Ambulatory Visit: Payer: BC Managed Care – PPO | Admitting: Family Medicine

## 2013-09-20 LAB — VITAMIN D 25 HYDROXY (VIT D DEFICIENCY, FRACTURES): VIT D 25 HYDROXY: 43 ng/mL (ref 30–89)

## 2013-09-23 ENCOUNTER — Telehealth (INDEPENDENT_AMBULATORY_CARE_PROVIDER_SITE_OTHER): Payer: BC Managed Care – PPO

## 2013-09-23 DIAGNOSIS — Z1211 Encounter for screening for malignant neoplasm of colon: Secondary | ICD-10-CM

## 2013-09-23 DIAGNOSIS — Z1212 Encounter for screening for malignant neoplasm of rectum: Principal | ICD-10-CM

## 2013-09-23 LAB — HEMOCCULT GUIAC POC 1CARD (OFFICE)
Card #2 Fecal Occult Blod, POC: NEGATIVE
FECAL OCCULT BLD: NEGATIVE
FECAL OCCULT BLD: NEGATIVE

## 2013-09-23 NOTE — Telephone Encounter (Signed)
Stool cards returned by patient.  Test performed and recorded.

## 2013-09-27 ENCOUNTER — Encounter: Payer: Self-pay | Admitting: Family Medicine

## 2013-09-27 ENCOUNTER — Ambulatory Visit (INDEPENDENT_AMBULATORY_CARE_PROVIDER_SITE_OTHER): Payer: BC Managed Care – PPO | Admitting: Family Medicine

## 2013-09-27 VITALS — BP 138/86 | HR 99 | Resp 16 | Ht 63.0 in | Wt 173.0 lb

## 2013-09-27 DIAGNOSIS — E8881 Metabolic syndrome: Secondary | ICD-10-CM

## 2013-09-27 DIAGNOSIS — E785 Hyperlipidemia, unspecified: Secondary | ICD-10-CM

## 2013-09-27 DIAGNOSIS — E66811 Obesity, class 1: Secondary | ICD-10-CM

## 2013-09-27 DIAGNOSIS — R5383 Other fatigue: Secondary | ICD-10-CM

## 2013-09-27 DIAGNOSIS — R5381 Other malaise: Secondary | ICD-10-CM

## 2013-09-27 DIAGNOSIS — I10 Essential (primary) hypertension: Secondary | ICD-10-CM

## 2013-09-27 DIAGNOSIS — E669 Obesity, unspecified: Secondary | ICD-10-CM

## 2013-09-27 NOTE — Patient Instructions (Signed)
F/u in 4 month, call if you need me before  Continue to be more diligent with lifestyle change , weifght loss goal of 2.5 pounds per month    Fasting lipid, chem 7, HBA1C in 4 month  Vegetables for evening meal

## 2013-09-28 NOTE — Assessment & Plan Note (Signed)
Improved.Slkight but still needs to be applauded and encouraged Pt applauded on succesful weight loss through lifestyle change, and encouraged to continue same. Weight loss goal set for the next several months.

## 2013-09-28 NOTE — Assessment & Plan Note (Addendum)
Pt aware of increased CV risk and the need to lower this by improvement in lipids and weight , she re commits to the cause Weight looss goal as well as exercise goal set

## 2013-09-28 NOTE — Assessment & Plan Note (Signed)
Sub optimal control, no med change today DASH diet and commitment to daily physical activity for a minimum of 30 minutes discussed and encouraged, as a part of hypertension management. The importance of attaining a healthy weight is also discussed.

## 2013-09-28 NOTE — Progress Notes (Signed)
   Subjective:    Patient ID: Carrie BillSandra Mcintyre, female    DOB: [birth year], 43 y.o.   MRN: [id number]  HPI The PT is here for follow up and re-evaluation of chronic medical conditions, medication management and review of any available recent lab and radiology data.  Preventive health is updated, specifically  Cancer screening and Immunization.   Questions or concerns regarding consultations or procedures which the PT has had in the interim are  Addressed.Succesful treatment of middle ear effusion with eustacian tube dysfunction since last visit, was treated by ENT, has also had annual skin exam in the Fall last year, admits to insufficient sunscreen use The PT denies any adverse reactions to current medications since the last visit.  There are no new concerns. She is concerned about sloe weight loss, but states she knows still overeats at dinner time, also insufficient exercise on review as well as insuffuceunt vegetable intake explains this There are no specific complaints       Review of Systems    See HPI Denies recent fever or chills. Denies sinus pressure, nasal congestion, ear pain or sore throat. Denies chest congestion, productive cough or wheezing. Denies chest pains, palpitations and leg swelling Denies abdominal pain, nausea, vomiting,diarrhea or constipation.   Denies dysuria, frequency, hesitancy or incontinence. Denies joint pain, swelling and limitation in mobility. Denies headaches, seizures, numbness, or tingling. Denies depression, anxiety or insomnia. Denies skin break down or rash.     Objective:   Physical Exam  Patient alert and oriented and in no cardiopulmonary distress.  HEENT: No facial asymmetry, EOMI, no sinus tenderness,  oropharynx pink and moist.  Neck supple no adenopathy.  Chest: Clear to auscultation bilaterally.  CVS: S1, S2 no murmurs, no S3.  ABD: Soft non tender. Bowel sounds normal.  Ext: No edema  MS: Adequate ROM spine, shoulders,  hips and knees.  Skin: Intact, no ulcerations or rash noted.  Psych: Good eye contact, normal affect. Memory intact not anxious or depressed appearing.  CNS: CN 2-12 intact, power, tone and sensation normal throughout.       Assessment & Plan:

## 2013-09-28 NOTE — Assessment & Plan Note (Signed)
Slight improvement, more vegetable intake and less pasta/bread, pt does noit wish to use medication and has corrected this in the past with diet only

## 2013-10-17 ENCOUNTER — Other Ambulatory Visit: Payer: Self-pay | Admitting: Family Medicine

## 2014-01-08 ENCOUNTER — Other Ambulatory Visit: Payer: Self-pay | Admitting: Family Medicine

## 2014-01-17 ENCOUNTER — Ambulatory Visit: Payer: BC Managed Care – PPO | Admitting: Family Medicine

## 2014-02-21 LAB — HEMOGLOBIN A1C
HEMOGLOBIN A1C: 5.3 % (ref <5.7)
Mean Plasma Glucose: 105 mg/dL (ref <117)

## 2014-02-22 LAB — BASIC METABOLIC PANEL
BUN: 14 mg/dL (ref 6–23)
CALCIUM: 8.9 mg/dL (ref 8.4–10.5)
CO2: 28 meq/L (ref 19–32)
Chloride: 100 mEq/L (ref 96–112)
Creat: 0.65 mg/dL (ref 0.50–1.10)
Glucose, Bld: 99 mg/dL (ref 70–99)
Potassium: 3.6 mEq/L (ref 3.5–5.3)
SODIUM: 136 meq/L (ref 135–145)

## 2014-02-22 LAB — LIPID PANEL
Cholesterol: 189 mg/dL (ref 0–200)
HDL: 55 mg/dL (ref >39)
LDL CALC: 111 mg/dL — AB (ref 0–99)
Total CHOL/HDL Ratio: 3.4 Ratio
Triglycerides: 115 mg/dL (ref <150)
VLDL: 23 mg/dL (ref 0–40)

## 2014-02-28 ENCOUNTER — Ambulatory Visit (INDEPENDENT_AMBULATORY_CARE_PROVIDER_SITE_OTHER): Payer: BC Managed Care – PPO | Admitting: Family Medicine

## 2014-02-28 ENCOUNTER — Encounter: Payer: Self-pay | Admitting: Family Medicine

## 2014-02-28 VITALS — BP 140/92 | HR 91 | Resp 16 | Ht 63.0 in | Wt 169.8 lb

## 2014-02-28 DIAGNOSIS — E8881 Metabolic syndrome: Secondary | ICD-10-CM

## 2014-02-28 DIAGNOSIS — E785 Hyperlipidemia, unspecified: Secondary | ICD-10-CM

## 2014-02-28 DIAGNOSIS — I10 Essential (primary) hypertension: Secondary | ICD-10-CM

## 2014-02-28 DIAGNOSIS — E669 Obesity, unspecified: Secondary | ICD-10-CM

## 2014-02-28 MED ORDER — TRIAMTERENE-HCTZ 75-50 MG PO TABS: 1.0000 | ORAL_TABLET | Freq: Every day | ORAL | Status: DC

## 2014-02-28 NOTE — Assessment & Plan Note (Signed)
Marked improvement in lipids, modest in weight, continue same Updated blood sugar and hBa1C next visit

## 2014-02-28 NOTE — Patient Instructions (Signed)
F/u in 4 month, call if you need me before  Congrats  On excellent progress keep it up!  HBA1C, fasting lipid and CMP and cBc in 4 month  BP is high, new dose of maxzide is 75/50 one daily, OK to take TWO of your current pills37.5/25 daily , instead of 1.5 until they are done  It is important that you exercise regularly at least 30 minutes 5 times a week. If you develop chest pain, have severe difficulty breathing, or feel very tired, stop exercising immediately and seek medical attention   Look closely at fat and carbohydrate content of foods and limit portions  Mammogram due 10/25 or after p-lease schedule

## 2014-02-28 NOTE — Progress Notes (Signed)
   Subjective:    Patient ID: Carrie Mcintyre, female    DOB: 05-04-1971, 43 y.o.   MRN: [id number]  HPI The PT is here for follow up and re-evaluation of chronic medical conditions, medication management and review of any available recent lab and radiology data.  Preventive health is updated, specifically  Cancer screening and Immunization.    The PT denies any adverse reactions to current medications since the last visit.  There are no new concerns.  There are no specific complaints       Review of Systems See HPI Denies recent fever or chills. Denies sinus pressure, nasal congestion, ear pain or sore throat. Denies chest congestion, productive cough or wheezing. Denies chest pains, palpitations and leg swelling Denies abdominal pain, nausea, vomiting,diarrhea or constipation.   Denies dysuria, frequency, hesitancy or incontinence. Denies joint pain, swelling and limitation in mobility. Denies headaches, seizures, numbness, or tingling. Denies depression, anxiety or insomnia. Denies skin break down or rash.        Objective:   Physical Exam BP 140/92  Pulse 91  Resp 16  Ht 5\' 3"  (1.6 m)  Wt 169 lb 12.8 oz (77.021 kg)  BMI 30.09 kg/m2  SpO2 99% Patient alert and oriented and in no cardiopulmonary distress.  HEENT: No facial asymmetry, EOMI,   oropharynx pink and moist.  Neck supple no JVD, no mass.  Chest: Clear to auscultation bilaterally.  CVS: S1, S2 no murmurs, no S3.  ABD: Soft non tender.   Ext: No edema  MS: Adequate ROM spine, shoulders, hips and knees.  Skin: Intact, no ulcerations or rash noted.  Psych: Good eye contact, normal affect. Memory intact not anxious or depressed appearing.  CNS: CN 2-12 intact, power,  normal throughout.no focal deficits noted.        Assessment & Plan:  HYPERTENSION Uncontrolled, increase dose of maxzide DASH diet and commitment to daily physical activity for a minimum of 30 minutes discussed and encouraged,  as a part of hypertension management. The importance of attaining a healthy weight is also discussed.   HYPERLIPIDEMIA Marked improvement with dietary change only, continue same, pt applauded on this Hyperlipidemia:Low fat diet discussed and encouraged.  Updated lab needed at/ before next visit.   Obesity (BMI 30.0-34.9) Improved. Pt applauded on succesful weight loss through lifestyle change, and encouraged to continue same. Weight loss goal set for the next several months.   Metabolic syndrome X Marked improvement in lipids, modest in weight, continue same Updated blood sugar and hBa1C next visit

## 2014-02-28 NOTE — Assessment & Plan Note (Signed)
Marked improvement with dietary change only, continue same, pt applauded on this Hyperlipidemia:Low fat diet discussed and encouraged.  Updated lab needed at/ before next visit.

## 2014-02-28 NOTE — Assessment & Plan Note (Signed)
Uncontrolled, increase dose of maxzide DASH diet and commitment to daily physical activity for a minimum of 30 minutes discussed and encouraged, as a part of hypertension management. The importance of attaining a healthy weight is also discussed.

## 2014-02-28 NOTE — Assessment & Plan Note (Signed)
Improved. Pt applauded on succesful weight loss through lifestyle change, and encouraged to continue same. Weight loss goal set for the next several months.  

## 2014-06-06 ENCOUNTER — Other Ambulatory Visit: Payer: Self-pay

## 2014-06-06 DIAGNOSIS — Z1231 Encounter for screening mammogram for malignant neoplasm of breast: Secondary | ICD-10-CM

## 2014-06-20 ENCOUNTER — Ambulatory Visit: Payer: BC Managed Care – PPO | Admitting: Family Medicine

## 2014-07-17 ENCOUNTER — Ambulatory Visit
Admission: RE | Admit: 2014-07-17 | Discharge: 2014-07-17 | Disposition: A | Discharge status: Home / Self Care | Payer: BC Managed Care – PPO | Source: Ambulatory Visit

## 2014-07-17 DIAGNOSIS — Z1231 Encounter for screening mammogram for malignant neoplasm of breast: Secondary | ICD-10-CM

## 2014-08-22 LAB — CBC WITH DIFFERENTIAL/PLATELET
BASOS ABS: 0 10*3/uL (ref 0.0–0.1)
Basophils Relative: 0 % (ref 0–1)
Eosinophils Absolute: 0.2 10*3/uL (ref 0.0–0.7)
Eosinophils Relative: 4 % (ref 0–5)
HCT: 38.8 % (ref 36.0–46.0)
HEMOGLOBIN: 13.4 g/dL (ref 12.0–15.0)
LYMPHS ABS: 1.1 10*3/uL (ref 0.7–4.0)
LYMPHS PCT: 24 % (ref 12–46)
MCH: 30.1 pg (ref 26.0–34.0)
MCHC: 34.5 g/dL (ref 30.0–36.0)
MCV: 87.2 fL (ref 78.0–100.0)
Monocytes Absolute: 0.5 10*3/uL (ref 0.1–1.0)
Monocytes Relative: 10 % (ref 3–12)
NEUTROS ABS: 2.9 10*3/uL (ref 1.7–7.7)
Neutrophils Relative %: 62 % (ref 43–77)
Platelets: 207 10*3/uL (ref 150–400)
RBC: 4.45 MIL/uL (ref 3.87–5.11)
RDW: 12.7 % (ref 11.5–15.5)
WBC: 4.6 10*3/uL (ref 4.0–10.5)

## 2014-08-23 LAB — COMPREHENSIVE METABOLIC PANEL
ALBUMIN: 4.3 g/dL (ref 3.5–5.2)
ALT: 15 U/L (ref 0–35)
AST: 19 U/L (ref 0–37)
Alkaline Phosphatase: 37 U/L — ABNORMAL LOW (ref 39–117)
BUN: 12 mg/dL (ref 6–23)
CO2: 29 meq/L (ref 19–32)
Calcium: 9.3 mg/dL (ref 8.4–10.5)
Chloride: 101 mEq/L (ref 96–112)
Creat: 0.6 mg/dL (ref 0.50–1.10)
Glucose, Bld: 105 mg/dL — ABNORMAL HIGH (ref 70–99)
Potassium: 3.8 mEq/L (ref 3.5–5.3)
SODIUM: 137 meq/L (ref 135–145)
TOTAL PROTEIN: 6.9 g/dL (ref 6.0–8.3)
Total Bilirubin: 1.6 mg/dL — ABNORMAL HIGH (ref 0.2–1.2)

## 2014-08-23 LAB — HEMOGLOBIN A1C
Hgb A1c MFr Bld: 5.2 %
Mean Plasma Glucose: 103 mg/dL

## 2014-08-23 LAB — LIPID PANEL
Cholesterol: 227 mg/dL — ABNORMAL HIGH (ref 0–200)
HDL: 70 mg/dL (ref >39)
LDL Cholesterol: 128 mg/dL — ABNORMAL HIGH (ref 0–99)
Total CHOL/HDL Ratio: 3.2 Ratio
Triglycerides: 143 mg/dL (ref <150)
VLDL: 29 mg/dL (ref 0–40)

## 2014-08-29 ENCOUNTER — Encounter: Payer: Self-pay | Admitting: Family Medicine

## 2014-08-29 ENCOUNTER — Ambulatory Visit (INDEPENDENT_AMBULATORY_CARE_PROVIDER_SITE_OTHER): Payer: BLUE CROSS/BLUE SHIELD | Admitting: Family Medicine

## 2014-08-29 ENCOUNTER — Ambulatory Visit (INDEPENDENT_AMBULATORY_CARE_PROVIDER_SITE_OTHER): Payer: BLUE CROSS/BLUE SHIELD

## 2014-08-29 VITALS — BP 124/86 | HR 83 | Resp 16 | Ht 63.0 in | Wt 172.0 lb

## 2014-08-29 DIAGNOSIS — Z23 Encounter for immunization: Secondary | ICD-10-CM | POA: Insufficient documentation

## 2014-08-29 DIAGNOSIS — E785 Hyperlipidemia, unspecified: Secondary | ICD-10-CM

## 2014-08-29 DIAGNOSIS — E669 Obesity, unspecified: Secondary | ICD-10-CM

## 2014-08-29 DIAGNOSIS — I10 Essential (primary) hypertension: Secondary | ICD-10-CM

## 2014-08-29 NOTE — Progress Notes (Signed)
   Subjective:    Patient ID: Bartholome BillSandra Foote, female    DOB: 01/30/1971, 43 y.o.   MRN: [id number]  HPI The PT is here for follow up and re-evaluation of chronic medical conditions, medication management and review of any available recent lab and radiology data.  Preventive health is updated, specifically  Cancer screening and Immunization.   Questions or concerns regarding consultations or procedures which the PT has had in the interim are  Addressed.She has had her skin exam and mammogram and is scheduled for eye exam next week The PT denies any adverse reactions to current medications since the last visit.  Has been eating more eggs and a breakfast burrito , with deterioration in lipids. Exercise at least 3 days per week, enjoys this, no weight loss as yet Sleep is good, not depressed, increased work stress but improved in recent times      Review of Systems See HPI Denies recent fever or chills. Denies sinus pressure, nasal congestion, ear pain or sore throat. Denies chest congestion, productive cough or wheezing. Denies chest pains, palpitations and leg swelling Denies abdominal pain, nausea, vomiting,diarrhea or constipation.   Denies dysuria, frequency, hesitancy or incontinence. Denies joint pain, swelling and limitation in mobility. Denies headaches, seizures, numbness, or tingling. Denies depression, anxiety or insomnia. Denies skin break down or rash.        Objective:   Physical Exam BP 124/86 mmHg  Pulse 83  Resp 16  Ht 5\' 3"  (1.6 m)  Wt 172 lb (78.019 kg)  BMI 30.48 kg/m2  SpO2 98% Patient alert and oriented and in no cardiopulmonary distress.  HEENT: No facial asymmetry, EOMI,   oropharynx pink and moist.  Neck supple no JVD, no mass.  Chest: Clear to auscultation bilaterally.  CVS: S1, S2 no murmurs, no S3.Regular rate.  ABD: Soft non tender.   Ext: No edema  MS: Adequate ROM spine, shoulders, hips and knees.  Skin: Intact, no ulcerations or  rash noted.  Psych: Good eye contact, normal affect. Memory intact not anxious or depressed appearing.  CNS: CN 2-12 intact, power,  normal throughout.no focal deficits noted.        Assessment & Plan:  Essential hypertension Controlled, no change in medication DASH diet and commitment to daily physical activity for a minimum of 30 minutes discussed and encouraged, as a part of hypertension management. The importance of attaining a healthy weight is also discussed.   Hyperlipidemia LDL goal <100 Deteriorated, dietary change needed Hyperlipidemia:Low fat diet discussed and encouraged.  Updated lab needed at/ before next visit.   Obesity (BMI 30.0-34.9) Unchanged Patient re-educated about  the importance of commitment to a  minimum of 150 minutes of exercise per week. The importance of healthy food choices with portion control discussed. Encouraged to start a food diary, count calories and to consider  joining a support group. Sample diet sheets offered. Goals set by the patient for the next several months.     Need for prophylactic vaccination and inoculation against influenza Vaccine administered at visit.

## 2014-08-29 NOTE — Assessment & Plan Note (Signed)
Deteriorated, dietary change needed Hyperlipidemia:Low fat diet discussed and encouraged.  Updated lab needed at/ before next visit.

## 2014-08-29 NOTE — Assessment & Plan Note (Signed)
Controlled, no change in medication DASH diet and commitment to daily physical activity for a minimum of 30 minutes discussed and encouraged, as a part of hypertension management. The importance of attaining a healthy weight is also discussed.  

## 2014-08-29 NOTE — Assessment & Plan Note (Signed)
Vaccine administered at visit.  

## 2014-08-29 NOTE — Assessment & Plan Note (Signed)
Unchanged. Patient re-educated about  the importance of commitment to a  minimum of 150 minutes of exercise per week. The importance of healthy food choices with portion control discussed. Encouraged to start a food diary, count calories and to consider  joining a support group. Sample diet sheets offered. Goals set by the patient for the next several months.    

## 2014-08-29 NOTE — Patient Instructions (Signed)
CPE in 6 month, call if you need me before  Fasting lipid, chem 7 and tSH in 6 months  Dietary change as discussed, and continue to enjoy exercise and life  Flu vaccine today  All the best for 2016!

## 2014-09-24 ENCOUNTER — Other Ambulatory Visit: Payer: Self-pay | Admitting: Family Medicine

## 2015-02-27 ENCOUNTER — Encounter: Payer: BC Managed Care – PPO | Admitting: Family Medicine

## 2015-03-04 ENCOUNTER — Other Ambulatory Visit: Payer: Self-pay | Admitting: Family Medicine

## 2015-03-04 LAB — BASIC METABOLIC PANEL
BUN: 12 mg/dL (ref 6–23)
CALCIUM: 9.1 mg/dL (ref 8.4–10.5)
CO2: 30 mEq/L (ref 19–32)
Chloride: 100 mEq/L (ref 96–112)
Creat: 0.58 mg/dL (ref 0.50–1.10)
Glucose, Bld: 107 mg/dL — ABNORMAL HIGH (ref 70–99)
Potassium: 3.9 mEq/L (ref 3.5–5.3)
Sodium: 140 mEq/L (ref 135–145)

## 2015-03-04 LAB — LIPID PANEL
CHOL/HDL RATIO: 3.6 ratio
Cholesterol: 242 mg/dL — ABNORMAL HIGH (ref 0–200)
HDL: 68 mg/dL (ref >=46)
LDL CALC: 137 mg/dL — AB (ref 0–99)
Triglycerides: 184 mg/dL — ABNORMAL HIGH (ref <150)
VLDL: 37 mg/dL (ref 0–40)

## 2015-03-04 LAB — TSH: TSH: 2.062 u[IU]/mL (ref 0.350–4.500)

## 2015-03-11 ENCOUNTER — Encounter: Payer: Self-pay | Admitting: Family Medicine

## 2015-03-11 ENCOUNTER — Ambulatory Visit (INDEPENDENT_AMBULATORY_CARE_PROVIDER_SITE_OTHER): Payer: BLUE CROSS/BLUE SHIELD | Admitting: Family Medicine

## 2015-03-11 ENCOUNTER — Other Ambulatory Visit (HOSPITAL_COMMUNITY)
Admission: RE | Admit: 2015-03-11 | Discharge: 2015-03-11 | Disposition: A | Discharge status: Home / Self Care | Payer: BLUE CROSS/BLUE SHIELD | Source: Ambulatory Visit | Attending: Family Medicine | Admitting: Family Medicine

## 2015-03-11 VITALS — BP 128/84 | HR 66 | Resp 18 | Ht 63.0 in | Wt 178.1 lb

## 2015-03-11 DIAGNOSIS — R7302 Impaired glucose tolerance (oral): Secondary | ICD-10-CM

## 2015-03-11 DIAGNOSIS — Z0000 Encounter for general adult medical examination without abnormal findings: Secondary | ICD-10-CM | POA: Diagnosis not present

## 2015-03-11 DIAGNOSIS — Z124 Encounter for screening for malignant neoplasm of cervix: Secondary | ICD-10-CM | POA: Diagnosis not present

## 2015-03-11 DIAGNOSIS — Z1231 Encounter for screening mammogram for malignant neoplasm of breast: Secondary | ICD-10-CM | POA: Diagnosis not present

## 2015-03-11 DIAGNOSIS — E785 Hyperlipidemia, unspecified: Secondary | ICD-10-CM

## 2015-03-11 DIAGNOSIS — Z01419 Encounter for gynecological examination (general) (routine) without abnormal findings: Secondary | ICD-10-CM | POA: Insufficient documentation

## 2015-03-11 DIAGNOSIS — Z114 Encounter for screening for human immunodeficiency virus [HIV]: Secondary | ICD-10-CM

## 2015-03-11 DIAGNOSIS — Z1151 Encounter for screening for human papillomavirus (HPV): Secondary | ICD-10-CM | POA: Diagnosis present

## 2015-03-11 DIAGNOSIS — I10 Essential (primary) hypertension: Secondary | ICD-10-CM

## 2015-03-11 NOTE — Progress Notes (Signed)
   Subjective:    Patient ID: Carrie Mcintyre, female    DOB: [birth year], 44 y.o.   MRN: [id number]  HPI Patient is in for annual physical exam.  Recent labs, if available are reviewed. Immunization is reviewed , and  updated if needed.    Review of Systems See HPI     Objective:   Physical Exam  BP 128/84 mmHg  Pulse 66  Resp 18  Ht 5\' 3"  (1.6 m)  Wt 178 lb 1.3 oz (80.777 kg)  BMI 31.55 kg/m2  SpO2 98%   Pleasant well nourished female, alert and oriented x 3, in no cardio-pulmonary distress. Afebrile. HEENT No facial trauma or asymetry. Sinuses non tender.  Extra occullar muscles intact, External ears normal, tympanic membranes clear. Oropharynx moist, no exudate, good dentition. Neck: supple, no adenopathy,JVD or thyromegaly.No bruits.  Chest: Clear to ascultation bilaterally.No crackles or wheezes. Non tender to palpation  Breast: No asymetry,no masses or lumps. No tenderness. No nipple discharge or inversion. No axillary or supraclavicular adenopathy  Cardiovascular system; Heart sounds normal,  S1 and  S2 ,no S3.  No murmur, or thrill. Apical beat not displaced Peripheral pulses normal.  Abdomen: Soft, non tender, no organomegaly or masses. No bruits. Bowel sounds normal. No guarding, tenderness or rebound.  Rectal:  Deferred , pt to return stool cards, by choice GU: External genitalia normal female genitalia , female distribution of hair. No lesions. Urethral meatus normal in size, no  Prolapse, no lesions visibly  Present. Bladder non tender. Vagina pink and moist , with no visible lesions , discharge present . Adequate pelvic support no  cystocele or rectocele noted Cervix pink and appears healthy, no lesions or ulcerations noted, no discharge noted from os Uterus normal size, no adnexal masses, no cervical motion or adnexal tenderness.   Musculoskeletal exam: Full ROM of spine, hips , shoulders and knees. No deformity ,swelling or crepitus  noted. No muscle wasting or atrophy.   Neurologic: Cranial nerves 2 to 12 intact. Power, tone ,sensation and reflexes normal throughout. No disturbance in gait. No tremor.  Skin: Intact, no ulceration, erythema , scaling or rash noted. Multiple hyperpigmented nevi, none appear suspicious for malignancy Psych; Normal mood and affect. Judgement and concentration normal       Assessment & Plan:  Annual physical exam Annual exam as documented. Counseling done  re healthy lifestyle involving commitment to 150 minutes exercise per week, heart healthy diet, and attaining healthy weight.The importance of adequate sleep also discussed. Regular seat belt use and home safety, is also discussed. Changes in health habits are decided on by the patient with goals and time frames  set for achieving them. Immunization and cancer screening needs are specifically addressed at this visit.

## 2015-03-11 NOTE — Patient Instructions (Addendum)
F/u mid December , call if you need me before  Fasting labs 12/12 or sghortly after  Pls return stool cards within next 2 weeks  Please work on good  health habits so that your health will improve. 1. Commitment to daily physical activity for 30 to 60  minutes, if you are able to do this.  2. Commitment to wise food choices. Aim for half of your  food intake to be vegetable and fruit, one quarter starchy foods, and one quarter protein. Try to eat on a regular schedule  3 meals per day, snacking between meals should be limited to vegetables or fruits or small portions of nuts. 64 ounces of water per day is generally recommended, unless you have specific health conditions, like heart failure or kidney failure where you will need to limit fluid intake.  3. Commitment to sufficient and a  good quality of physical and mental rest daily, generally between 6 to 8 hours per day.  WITH PERSISTANCE AND PERSEVERANCE, THE IMPOSSIBLE , BECOMES THE NORM!  Thanks for [facility], we consider it a privelige to serve you. Mammogram is due Nov 6 or after pls sched

## 2015-03-11 NOTE — Assessment & Plan Note (Signed)

## 2015-03-12 LAB — CYTOLOGY - PAP

## 2015-03-13 ENCOUNTER — Telehealth: Payer: Self-pay | Admitting: Family Medicine

## 2015-03-13 NOTE — Telephone Encounter (Signed)
noted 

## 2015-04-08 ENCOUNTER — Other Ambulatory Visit: Payer: Self-pay

## 2015-04-08 MED ORDER — TRIAMTERENE-HCTZ 75-50 MG PO TABS: 1.0000 | ORAL_TABLET | Freq: Every day | ORAL | Status: DC

## 2015-07-20 ENCOUNTER — Ambulatory Visit (HOSPITAL_COMMUNITY): Payer: BLUE CROSS/BLUE SHIELD

## 2015-08-14 ENCOUNTER — Ambulatory Visit
Admission: RE | Admit: 2015-08-14 | Discharge: 2015-08-14 | Disposition: A | Discharge status: Home / Self Care | Payer: BLUE CROSS/BLUE SHIELD | Source: Ambulatory Visit | Attending: Family Medicine | Admitting: Family Medicine

## 2015-08-14 DIAGNOSIS — Z1231 Encounter for screening mammogram for malignant neoplasm of breast: Secondary | ICD-10-CM

## 2015-08-18 ENCOUNTER — Ambulatory Visit: Payer: BLUE CROSS/BLUE SHIELD | Admitting: Family Medicine

## 2015-09-17 LAB — CBC
HEMATOCRIT: 39.8 % (ref 36.0–46.0)
Hemoglobin: 14.3 g/dL (ref 12.0–15.0)
MCH: 30.8 pg (ref 26.0–34.0)
MCHC: 35.9 g/dL (ref 30.0–36.0)
MCV: 85.8 fL (ref 78.0–100.0)
MPV: 9.6 fL (ref 8.6–12.4)
Platelets: 194 10*3/uL (ref 150–400)
RBC: 4.64 MIL/uL (ref 3.87–5.11)
RDW: 12.5 % (ref 11.5–15.5)
WBC: 4.8 10*3/uL (ref 4.0–10.5)

## 2015-09-17 LAB — BASIC METABOLIC PANEL
BUN: 13 mg/dL (ref 7–25)
CHLORIDE: 103 mmol/L (ref 98–110)
CO2: 24 mmol/L (ref 20–31)
Calcium: 9 mg/dL (ref 8.6–10.2)
Creat: 0.64 mg/dL (ref 0.50–1.10)
Glucose, Bld: 104 mg/dL — ABNORMAL HIGH (ref 65–99)
POTASSIUM: 3.9 mmol/L (ref 3.5–5.3)
Sodium: 137 mmol/L (ref 135–146)

## 2015-09-17 LAB — LIPID PANEL
Cholesterol: 208 mg/dL — ABNORMAL HIGH (ref 125–200)
HDL: 71 mg/dL (ref >=46)
LDL CALC: 109 mg/dL (ref <130)
Total CHOL/HDL Ratio: 2.9 Ratio (ref <=5.0)
Triglycerides: 139 mg/dL (ref <150)
VLDL: 28 mg/dL (ref <30)

## 2015-09-17 LAB — TSH: TSH: 1.918 u[IU]/mL (ref 0.350–4.500)

## 2015-09-17 LAB — HIV ANTIBODY (ROUTINE TESTING W REFLEX): HIV 1&2 Ab, 4th Generation: NONREACTIVE

## 2015-09-18 LAB — HEMOGLOBIN A1C
Hgb A1c MFr Bld: 5.3 % (ref <5.7)
Mean Plasma Glucose: 105 mg/dL (ref <117)

## 2015-09-23 ENCOUNTER — Ambulatory Visit (INDEPENDENT_AMBULATORY_CARE_PROVIDER_SITE_OTHER): Payer: BLUE CROSS/BLUE SHIELD | Admitting: Family Medicine

## 2015-09-23 ENCOUNTER — Encounter: Payer: Self-pay | Admitting: Family Medicine

## 2015-09-23 VITALS — BP 124/82 | HR 100 | Resp 18 | Ht 63.0 in | Wt 178.0 lb

## 2015-09-23 DIAGNOSIS — E785 Hyperlipidemia, unspecified: Secondary | ICD-10-CM

## 2015-09-23 DIAGNOSIS — Z23 Encounter for immunization: Secondary | ICD-10-CM | POA: Diagnosis not present

## 2015-09-23 DIAGNOSIS — E669 Obesity, unspecified: Secondary | ICD-10-CM

## 2015-09-23 DIAGNOSIS — I10 Essential (primary) hypertension: Secondary | ICD-10-CM

## 2015-09-23 MED ORDER — FLUTICASONE PROPIONATE 50 MCG/ACT NA SUSP: 2.0000 | Freq: Every day | NASAL | Status: DC

## 2015-09-23 MED ORDER — TRIAMTERENE-HCTZ 75-50 MG PO TABS: 1.0000 | ORAL_TABLET | Freq: Every day | ORAL | Status: DC

## 2015-09-23 NOTE — Assessment & Plan Note (Signed)
Improved, pt applauded on this and encouraged to coontinue in this direction Updated lab needed at/ before next visit. Hyperlipidemia:Low fat diet discussed and encouraged.   Lipid Panel  Lab Results  Component Value Date   CHOL 208* 09/17/2015   HDL 71 09/17/2015   LDLCALC 109 09/17/2015   TRIG 139 09/17/2015   CHOLHDL 2.9 09/17/2015

## 2015-09-23 NOTE — Assessment & Plan Note (Signed)
After obtaining informed consent, the vaccine is  administered by LPN.  

## 2015-09-23 NOTE — Patient Instructions (Addendum)
F/u in 6  Months, call if you need me before  Please work on good  health habits so that your health will improve. 1. Commitment to daily physical activity for 30 to 60  minutes, if you are able to do this.  2. Commitment to wise food choices. Aim for half of your  food intake to be vegetable and fruit, one quarter starchy foods, and one quarter protein. Try to eat on a regular schedule  3 meals per day, snacking between meals should be limited to vegetables or fruits or small portions of nuts. 64 ounces of water per day is generally recommended, unless you have specific health conditions, like heart failure or kidney failure where you will need to limit fluid intake.  3. Commitment to sufficient and a  good quality of physical and mental rest daily, generally between 6 to 8 hours per day.  WITH PERSISTANCE AND PERSEVERANCE, THE IMPOSSIBLE , BECOMES THE NORM!  Congrats on improved lipids  Weight loss goal of 10 pounds  Thanks for choosing [facility], we consider it a privelige to serve you.   All the best for 2017!  PLEASE submit stool cards for colon screening, this is pAST due!

## 2015-09-23 NOTE — Assessment & Plan Note (Signed)
Controlled, no change in medication DASH diet and commitment to daily physical activity for a minimum of 30 minutes discussed and encouraged, as a part of hypertension management. The importance of attaining a healthy weight is also discussed.  BP/Weight 09/23/2015 03/11/2015 08/29/2014 02/28/2014 09/27/2013 05/01/2013 02/22/2013  Systolic BP 124 128 124 140 138 120 132  Diastolic BP 82 84 86 92 86 82 92  Wt. (Lbs) 178 178.08 172 169.8 173 176.12 172  BMI 31.54 31.55 30.48 30.09 30.65 31.21 30.48

## 2015-09-23 NOTE — Progress Notes (Signed)
Subjective:    Patient ID: Carrie Mcintyre, female    DOB: 04-18-1971, 45 y.o.   MRN: [id number]  HPI   Carrie Mcintyre     MRN: [id number]      DOB: [birth year]   HPI Carrie Mcintyre is here for follow up and re-evaluation of chronic medical conditions, medication management and review of any available recent lab and radiology data.  Preventive health is updated, specifically  Cancer screening and Immunization.   Questions or concerns regarding consultations or procedures which the PT has had in the interim are  addressed. The PT denies any adverse reactions to current medications since the last visit.  There are no new concerns.  There are no specific complaints   ROS Denies recent fever or chills. Denies sinus pressure, nasal congestion, ear pain or sore throat. Denies chest congestion, productive cough or wheezing. Denies chest pains, palpitations and leg swelling Denies abdominal pain, nausea, vomiting,diarrhea or constipation.   Denies dysuria, frequency, hesitancy or incontinence. Denies joint pain, swelling and limitation in mobility. Denies headaches, seizures, numbness, or tingling. Denies depression, anxiety or insomnia. Denies skin break down or rash.   PE  BP 124/82 mmHg  Pulse 100  Resp 18  Ht 5\' 3"  (1.6 m)  Wt 178 lb (80.74 kg)  BMI 31.54 kg/m2  SpO2 98%  Patient alert and oriented and in no cardiopulmonary distress.  HEENT: No facial asymmetry, EOMI,   oropharynx pink and moist.  Neck supple no JVD, no mass.  Chest: Clear to auscultation bilaterally.  CVS: S1, S2 no murmurs, no S3.Regular rate.  ABD: Soft non tender.   Ext: No edema  MS: Adequate ROM spine, shoulders, hips and knees.  Skin: Intact, no ulcerations or rash noted.  Psych: Good eye contact, normal affect. Memory intact not anxious or depressed appearing.  CNS: CN 2-12 intact, power,  normal throughout.no focal deficits noted.   Assessment & Plan   Essential  hypertension Controlled, no change in medication DASH diet and commitment to daily physical activity for a minimum of 30 minutes discussed and encouraged, as a part of hypertension management. The importance of attaining a healthy weight is also discussed.  BP/Weight 09/23/2015 03/11/2015 08/29/2014 02/28/2014 09/27/2013 05/01/2013 02/22/2013  Systolic BP 124 128 124 140 138 120 132  Diastolic BP 82 84 86 92 86 82 92  Wt. (Lbs) 178 178.08 172 169.8 173 176.12 172  BMI 31.54 31.55 30.48 30.09 30.65 31.21 30.48        Hyperlipidemia LDL goal <100 Improved, pt applauded on this and encouraged to coontinue in this direction Updated lab needed at/ before next visit. Hyperlipidemia:Low fat diet discussed and encouraged.   Lipid Panel  Lab Results  Component Value Date   CHOL 208* 09/17/2015   HDL 71 09/17/2015   LDLCALC 109 09/17/2015   TRIG 139 09/17/2015   CHOLHDL 2.9 09/17/2015        Obesity (BMI 30.0-34.9) Deteriorated. Patient re-educated about  the importance of commitment to a  minimum of 150 minutes of exercise per week.  The importance of healthy food choices with portion control discussed. Encouraged to start a food diary, count calories and to consider  joining a support group. Sample diet sheets offered. Goals set by the patient for the next several months.   Weight /BMI 09/23/2015 03/11/2015 08/29/2014  WEIGHT 178 lb 178 lb 1.3 oz 172 lb  HEIGHT 5\' 3"  5\' 3"  5\' 3"   BMI 31.54 kg/m2 31.55 kg/m2 30.48 kg/m2  Current exercise per week 120 minutes.   Need for prophylactic vaccination and inoculation against influenza After obtaining informed consent, the vaccine is  administered by LPN.       Review of Systems     Objective:   Physical Exam        Assessment & Plan:

## 2015-09-23 NOTE — Assessment & Plan Note (Signed)
Deteriorated. Patient re-educated about  the importance of commitment to a  minimum of 150 minutes of exercise per week.  The importance of healthy food choices with portion control discussed. Encouraged to start a food diary, count calories and to consider  joining a support group. Sample diet sheets offered. Goals set by the patient for the next several months.   Weight /BMI 09/23/2015 03/11/2015 08/29/2014  WEIGHT 178 lb 178 lb 1.3 oz 172 lb  HEIGHT 5\' 3"  5\' 3"  5\' 3"   BMI 31.54 kg/m2 31.55 kg/m2 30.48 kg/m2    Current exercise per week 120 minutes.

## 2016-04-06 ENCOUNTER — Telehealth: Payer: Self-pay | Admitting: Family Medicine

## 2016-04-06 ENCOUNTER — Other Ambulatory Visit: Payer: Self-pay

## 2016-04-06 DIAGNOSIS — E785 Hyperlipidemia, unspecified: Secondary | ICD-10-CM

## 2016-04-06 DIAGNOSIS — I10 Essential (primary) hypertension: Secondary | ICD-10-CM

## 2016-04-06 DIAGNOSIS — E8881 Metabolic syndrome: Secondary | ICD-10-CM

## 2016-04-06 NOTE — Telephone Encounter (Signed)
Already entered this am

## 2016-04-06 NOTE — Telephone Encounter (Signed)
Will you please put in new labs for Carrie Mcintyre - she went to get them but hers were expired.

## 2016-04-11 ENCOUNTER — Ambulatory Visit: Payer: BLUE CROSS/BLUE SHIELD | Admitting: Family Medicine

## 2016-04-12 ENCOUNTER — Telehealth: Payer: Self-pay

## 2016-04-12 DIAGNOSIS — E8881 Metabolic syndrome: Secondary | ICD-10-CM | POA: Diagnosis not present

## 2016-04-12 DIAGNOSIS — E785 Hyperlipidemia, unspecified: Secondary | ICD-10-CM | POA: Diagnosis not present

## 2016-04-12 DIAGNOSIS — I10 Essential (primary) hypertension: Secondary | ICD-10-CM | POA: Diagnosis not present

## 2016-04-12 LAB — BASIC METABOLIC PANEL
BUN: 13 mg/dL (ref 7–25)
CALCIUM: 9.1 mg/dL (ref 8.6–10.2)
CHLORIDE: 101 mmol/L (ref 98–110)
CO2: 26 mmol/L (ref 20–31)
CREATININE: 0.73 mg/dL (ref 0.50–1.10)
Glucose, Bld: 112 mg/dL — ABNORMAL HIGH (ref 65–99)
Potassium: 4 mmol/L (ref 3.5–5.3)
Sodium: 136 mmol/L (ref 135–146)

## 2016-04-12 LAB — LIPID PANEL
Cholesterol: 223 mg/dL — ABNORMAL HIGH (ref 125–200)
HDL: 63 mg/dL (ref >=46)
LDL CALC: 127 mg/dL (ref <130)
Total CHOL/HDL Ratio: 3.5 Ratio (ref <=5.0)
Triglycerides: 163 mg/dL — ABNORMAL HIGH (ref <150)
VLDL: 33 mg/dL — ABNORMAL HIGH (ref <30)

## 2016-04-12 LAB — HEMOGLOBIN A1C
HEMOGLOBIN A1C: 5.2 % (ref <5.7)
MEAN PLASMA GLUCOSE: 103 mg/dL

## 2016-04-12 NOTE — Telephone Encounter (Signed)
labs ordered

## 2016-04-14 ENCOUNTER — Ambulatory Visit (INDEPENDENT_AMBULATORY_CARE_PROVIDER_SITE_OTHER): Payer: BLUE CROSS/BLUE SHIELD | Admitting: Family Medicine

## 2016-04-14 ENCOUNTER — Encounter: Payer: Self-pay | Admitting: Family Medicine

## 2016-04-14 VITALS — BP 118/82 | HR 91 | Resp 16 | Ht 63.0 in | Wt 179.0 lb

## 2016-04-14 DIAGNOSIS — J3089 Other allergic rhinitis: Secondary | ICD-10-CM

## 2016-04-14 DIAGNOSIS — I10 Essential (primary) hypertension: Secondary | ICD-10-CM | POA: Diagnosis not present

## 2016-04-14 DIAGNOSIS — E8881 Metabolic syndrome: Secondary | ICD-10-CM

## 2016-04-14 DIAGNOSIS — E785 Hyperlipidemia, unspecified: Secondary | ICD-10-CM | POA: Diagnosis not present

## 2016-04-14 DIAGNOSIS — E669 Obesity, unspecified: Secondary | ICD-10-CM

## 2016-04-14 DIAGNOSIS — Z808 Family history of malignant neoplasm of other organs or systems: Secondary | ICD-10-CM

## 2016-04-14 NOTE — Patient Instructions (Signed)
F/u mid January, call if you need me before  Next week pls drop off stool card  Get mammogram in December  Schedule skin exam  Weight loss goal to 8 pounds  Flu vaccine in September  Please work on good  health habits so that your health will improve. 1. Commitment to daily physical activity for 30 to 60  minutes, if you are able to do this.  2. Commitment to wise food choices. Aim for half of your  food intake to be vegetable and fruit, one quarter starchy foods, and one quarter protein. Try to eat on a regular schedule  3 meals per day, snacking between meals should be limited to vegetables or fruits or small portions of nuts. 64 ounces of water per day is generally recommended, unless you have specific health conditions, like heart failure or kidney failure where you will need to limit fluid intake.  3. Commitment to sufficient and a  good quality of physical and mental rest daily, generally between 6 to 8 hours per day.  WITH PERSISTANCE AND PERSEVERANCE, THE IMPOSSIBLE , BECOMES THE NORM!  Fasting labs  For Jan  Visit  Thank you  for choosing [facility]. We consider it a privelige to serve you.  Delivering excellent health care in a caring and  compassionate way is our goal.  Partnering with you,  so that together we can achieve this goal is our strategy.  PRACTICE GRACE!!

## 2016-04-16 DIAGNOSIS — J309 Allergic rhinitis, unspecified: Secondary | ICD-10-CM | POA: Insufficient documentation

## 2016-04-16 HISTORY — DX: Allergic rhinitis, unspecified: J30.9

## 2016-04-16 NOTE — Progress Notes (Signed)
Carrie Mcintyre     MRN: [id number]      DOB: 11/20/1970   HPI Ms. Vizcarra is here for follow up and re-evaluation of chronic medical conditions, medication management and review of any available recent lab and radiology data.  Preventive health is updated, specifically  Cancer screening and Immunization.   Questions or concerns regarding consultations or procedures which the PT has had in the interim are  addressed. The PT denies any adverse reactions to current medications since the last visit.  Increased stress in family, personal and work environment , however , coping well overall, and has support from her sister. No regular exercise and eating habits not as controlled as she wishes   ROS Denies recent fever or chills. Denies sinus pressure, nasal congestion, ear pain or sore throat. Denies chest congestion, productive cough or wheezing. Denies chest pains, palpitations and leg swelling Denies abdominal pain, nausea, vomiting,diarrhea or constipation.   Denies dysuria, frequency, hesitancy or incontinence. Denies joint pain, swelling and limitation in mobility. Denies headaches, seizures, numbness, or tingling. Denies depression, anxiety or insomnia. Denies skin break down or rash.   PE  BP 118/82   Pulse 91   Resp 16   Ht 5\' 3"  (1.6 m)   Wt 179 lb (81.2 kg)   SpO2 96%   BMI 31.71 kg/m   Patient alert and oriented and in no cardiopulmonary distress.  HEENT: No facial asymmetry, EOMI,   oropharynx pink and moist.  Neck supple no JVD, no mass.  Chest: Clear to auscultation bilaterally.  CVS: S1, S2 no murmurs, no S3.Regular rate.  ABD: Soft non tender.   Ext: No edema  MS: Adequate ROM spine, shoulders, hips and knees.  Skin: Intact, no ulcerations or rash noted.  Psych: Good eye contact, normal affect. Memory intact not anxious or depressed appearing.  CNS: CN 2-12 intact, power,  normal throughout.no focal deficits noted.   Assessment &  Plan  Essential hypertension Controlled, no change in medication DASH diet and commitment to daily physical activity for a minimum of 30 minutes discussed and encouraged, as a part of hypertension management. The importance of attaining a healthy weight is also discussed.  BP/Weight 04/14/2016 09/23/2015 03/11/2015 08/29/2014 02/28/2014 09/27/2013 05/01/2013  Systolic BP 118 124 128 124 140 138 120  Diastolic BP 82 82 84 86 92 86 82  Wt. (Lbs) 179 178 178.08 172 169.8 173 176.12  BMI 31.71 31.54 31.55 30.48 30.09 30.65 31.21       Metabolic syndrome X The increased risk of cardiovascular disease associated with this diagnosis, and the need to consistently work on lifestyle to change this is discussed. Following  a  heart healthy diet ,commitment to 30 minutes of exercise at least 5 days per week, as well as control of blood sugar and cholesterol , and achieving a healthy weight are all the areas to be addressed .   Hyperlipidemia LDL goal <100 Deteriorated Hyperlipidemia:Low fat diet discussed and encouraged.   Lipid Panel  Lab Results  Component Value Date   CHOL 223 (H) 04/12/2016   HDL 63 04/12/2016   LDLCALC 127 04/12/2016   TRIG 163 (H) 04/12/2016   CHOLHDL 3.5 04/12/2016   Updated lab needed at/ before next visit.     Obesity (BMI 30.0-34.9) Deteriorated. Patient re-educated about  the importance of commitment to a  minimum of 150 minutes of exercise per week.  The importance of healthy food choices with portion control discussed. Encouraged to start a food  diary, count calories and to consider  joining a support group. Sample diet sheets offered. Goals set by the patient for the next several months.   Weight /BMI 04/14/2016 09/23/2015 03/11/2015  WEIGHT 179 lb 178 lb 178 lb 1.3 oz  HEIGHT     BMI 31.71 kg/m2 31.54 kg/m2 31.55 kg/m2      FH: skin cancer No new skin lesions of concern , but will keep her annual skin exam with dermatology in early  Fall  Allergic rhinitis Encouraged to commit to regular use to alleviate or reduce flares of uncontrolled symptoms

## 2016-04-16 NOTE — Assessment & Plan Note (Signed)
No new skin lesions of concern , but will keep her annual skin exam with dermatology in early Fall

## 2016-04-16 NOTE — Assessment & Plan Note (Signed)
The increased risk of cardiovascular disease associated with this diagnosis, and the need to consistently work on lifestyle to change this is discussed. Following  a  heart healthy diet ,commitment to 30 minutes of exercise at least 5 days per week, as well as control of blood sugar and cholesterol , and achieving a healthy weight are all the areas to be addressed .  

## 2016-04-16 NOTE — Assessment & Plan Note (Signed)
Controlled, no change in medication DASH diet and commitment to daily physical activity for a minimum of 30 minutes discussed and encouraged, as a part of hypertension management. The importance of attaining a healthy weight is also discussed.  BP/Weight 04/14/2016 09/23/2015 03/11/2015 08/29/2014 02/28/2014 09/27/2013 05/01/2013  Systolic BP 118 124 128 124 140 138 120  Diastolic BP 82 82 84 86 92 86 82  Wt. (Lbs) 179 178 178.08 172 169.8 173 176.12  BMI 31.71 31.54 31.55 30.48 30.09 30.65 31.21

## 2016-04-16 NOTE — Assessment & Plan Note (Signed)
Deteriorated Hyperlipidemia:Low fat diet discussed and encouraged.   Lipid Panel  Lab Results  Component Value Date   CHOL 223 (H) 04/12/2016   HDL 63 04/12/2016   LDLCALC 127 04/12/2016   TRIG 163 (H) 04/12/2016   CHOLHDL 3.5 04/12/2016   Updated lab needed at/ before next visit.

## 2016-04-16 NOTE — Assessment & Plan Note (Signed)
Encouraged to commit to regular use to alleviate or reduce flares of uncontrolled symptoms

## 2016-04-16 NOTE — Assessment & Plan Note (Signed)
Deteriorated. Patient re-educated about  the importance of commitment to a  minimum of 150 minutes of exercise per week.  The importance of healthy food choices with portion control discussed. Encouraged to start a food diary, count calories and to consider  joining a support group. Sample diet sheets offered. Goals set by the patient for the next several months.   Weight /BMI 04/14/2016 09/23/2015 03/11/2015  WEIGHT 179 lb 178 lb 178 lb 1.3 oz  HEIGHT 5\' 3"  5\' 3"  5\' 3"   BMI 31.71 kg/m2 31.54 kg/m2 31.55 kg/m2

## 2016-04-25 ENCOUNTER — Other Ambulatory Visit (INDEPENDENT_AMBULATORY_CARE_PROVIDER_SITE_OTHER): Payer: BLUE CROSS/BLUE SHIELD

## 2016-04-25 DIAGNOSIS — Z1211 Encounter for screening for malignant neoplasm of colon: Secondary | ICD-10-CM

## 2016-04-25 LAB — HEMOCCULT GUIAC POC 1CARD (OFFICE)
FECAL OCCULT BLD: NEGATIVE
FECAL OCCULT BLD: NEGATIVE
FECAL OCCULT BLD: NEGATIVE

## 2016-07-07 ENCOUNTER — Encounter: Payer: Self-pay | Admitting: Family Medicine

## 2016-07-11 ENCOUNTER — Other Ambulatory Visit: Payer: Self-pay | Admitting: Family Medicine

## 2016-07-11 DIAGNOSIS — Z1231 Encounter for screening mammogram for malignant neoplasm of breast: Secondary | ICD-10-CM

## 2016-08-03 ENCOUNTER — Other Ambulatory Visit: Payer: Self-pay | Admitting: Family Medicine

## 2016-08-26 ENCOUNTER — Ambulatory Visit
Admission: RE | Admit: 2016-08-26 | Discharge: 2016-08-26 | Disposition: A | Discharge status: Home / Self Care | Payer: BLUE CROSS/BLUE SHIELD | Source: Ambulatory Visit | Attending: Family Medicine | Admitting: Family Medicine

## 2016-08-26 DIAGNOSIS — Z1231 Encounter for screening mammogram for malignant neoplasm of breast: Secondary | ICD-10-CM | POA: Diagnosis not present

## 2016-09-15 ENCOUNTER — Ambulatory Visit: Payer: BLUE CROSS/BLUE SHIELD | Admitting: Family Medicine

## 2016-10-04 DIAGNOSIS — I10 Essential (primary) hypertension: Secondary | ICD-10-CM | POA: Diagnosis not present

## 2016-10-04 DIAGNOSIS — E785 Hyperlipidemia, unspecified: Secondary | ICD-10-CM | POA: Diagnosis not present

## 2016-10-04 DIAGNOSIS — E8881 Metabolic syndrome: Secondary | ICD-10-CM | POA: Diagnosis not present

## 2016-10-04 LAB — CBC WITH DIFFERENTIAL/PLATELET
BASOS ABS: 0 {cells}/uL (ref 0–200)
Basophils Relative: 0 %
Eosinophils Absolute: 215 cells/uL (ref 15–500)
Eosinophils Relative: 5 %
HCT: 39.2 % (ref 35.0–45.0)
HEMOGLOBIN: 13.5 g/dL (ref 11.7–15.5)
LYMPHS ABS: 1032 {cells}/uL (ref 850–3900)
Lymphocytes Relative: 24 %
MCH: 30.8 pg (ref 27.0–33.0)
MCHC: 34.4 g/dL (ref 32.0–36.0)
MCV: 89.5 fL (ref 80.0–100.0)
MPV: 9.5 fL (ref 7.5–12.5)
Monocytes Absolute: 301 cells/uL (ref 200–950)
Monocytes Relative: 7 %
NEUTROS PCT: 64 %
Neutro Abs: 2752 cells/uL (ref 1500–7800)
Platelets: 185 10*3/uL (ref 140–400)
RBC: 4.38 MIL/uL (ref 3.80–5.10)
RDW: 13.1 % (ref 11.0–15.0)
WBC: 4.3 10*3/uL (ref 3.8–10.8)

## 2016-10-04 LAB — LIPID PANEL
Cholesterol: 247 mg/dL — ABNORMAL HIGH (ref <200)
HDL: 65 mg/dL (ref >50)
LDL CALC: 140 mg/dL — AB (ref <100)
TRIGLYCERIDES: 211 mg/dL — AB (ref <150)
Total CHOL/HDL Ratio: 3.8 Ratio (ref <5.0)
VLDL: 42 mg/dL — AB (ref <30)

## 2016-10-04 LAB — BASIC METABOLIC PANEL
BUN: 10 mg/dL (ref 7–25)
CHLORIDE: 101 mmol/L (ref 98–110)
CO2: 27 mmol/L (ref 20–31)
Calcium: 9.2 mg/dL (ref 8.6–10.2)
Creat: 0.61 mg/dL (ref 0.50–1.10)
Glucose, Bld: 108 mg/dL — ABNORMAL HIGH (ref 65–99)
POTASSIUM: 3.8 mmol/L (ref 3.5–5.3)
Sodium: 136 mmol/L (ref 135–146)

## 2016-10-04 LAB — TSH: TSH: 1.75 mIU/L

## 2016-10-05 LAB — HEMOGLOBIN A1C
HEMOGLOBIN A1C: 4.8 % (ref <5.7)
Mean Plasma Glucose: 91 mg/dL

## 2016-10-12 ENCOUNTER — Ambulatory Visit (INDEPENDENT_AMBULATORY_CARE_PROVIDER_SITE_OTHER): Payer: BLUE CROSS/BLUE SHIELD | Admitting: Family Medicine

## 2016-10-12 ENCOUNTER — Encounter: Payer: Self-pay | Admitting: Family Medicine

## 2016-10-12 VITALS — BP 118/82 | HR 99 | Resp 16 | Ht 63.0 in | Wt 185.1 lb

## 2016-10-12 DIAGNOSIS — E785 Hyperlipidemia, unspecified: Secondary | ICD-10-CM | POA: Diagnosis not present

## 2016-10-12 DIAGNOSIS — J302 Other seasonal allergic rhinitis: Secondary | ICD-10-CM

## 2016-10-12 DIAGNOSIS — E669 Obesity, unspecified: Secondary | ICD-10-CM

## 2016-10-12 DIAGNOSIS — I10 Essential (primary) hypertension: Secondary | ICD-10-CM

## 2016-10-12 MED ORDER — FLUTICASONE PROPIONATE 50 MCG/ACT NA SUSP: 2.0000 | Freq: Every day | NASAL | 5 refills | Status: DC

## 2016-10-12 MED ORDER — TRIAMTERENE-HCTZ 75-50 MG PO TABS: 1.0000 | ORAL_TABLET | Freq: Every day | ORAL | 1 refills | Status: DC

## 2016-10-12 NOTE — Patient Instructions (Addendum)
F/u in 6 months, call if you need me before  Return for flu vaccine It is important that you exercise regularly at least 30 minutes 5 times a week. If you develop chest pain, have severe difficulty breathing, or feel very tired, stop exercising immediately and seek medical attention   Fasting lipid, chem 7  Fasting lipid and chem 7  In 5 month and 3 weeks  Thank you  for choosing  [facility]. We consider it a privelige to serve you.  Delivering excellent health care in a caring and  compassionate way is our goal.  Partnering with you,  so that together we can achieve this goal is our strategy.

## 2016-10-13 NOTE — Assessment & Plan Note (Signed)
Controlled, no change in medication DASH diet and commitment to daily physical activity for a minimum of 30 minutes discussed and encouraged, as a part of hypertension management. The importance of attaining a healthy weight is also discussed.  BP/Weight 10/12/2016 04/14/2016 09/23/2015 03/11/2015 08/29/2014 02/28/2014 09/27/2013  Systolic BP 118 118 124 128 124 140 138  Diastolic BP 82 82 82 84 86 92 86  Wt. (Lbs) 185.12 179 178 178.08 172 169.8 173  BMI 32.79 31.71 31.54 31.55 30.48 30.09 30.65

## 2016-10-13 NOTE — Assessment & Plan Note (Signed)
Controlled, no change in medication  

## 2016-10-13 NOTE — Progress Notes (Signed)
Carrie Mcintyre     MRN: [id number]      DOB: [birth year]   HPI Carrie Mcintyre is here for follow up and re-evaluation of chronic medical conditions, medication management and review of any available recent lab and radiology data.  Preventive health is updated, specifically  Cancer screening and Immunization. Will return for flu vaccine, none available at time of visit. . The PT denies any adverse reactions to current medications since the last visit.  Ongoing personal stress made  Christmas very hard, was non compliant with diet and lipids have worsened, will redirect to a better outcome  Needs to schedule skin eval and will do so, goes annually ROS Denies recent fever or chills. Denies sinus pressure, nasal congestion, ear pain or sore throat. Denies chest congestion, productive cough or wheezing. Denies chest pains, palpitations and leg swelling Denies abdominal pain, nausea, vomiting,diarrhea or constipation.   Denies dysuria, frequency, hesitancy or incontinence. Denies joint pain, swelling and limitation in mobility. Denies headaches, seizures, numbness, or tingling. Denies depression, anxiety or insomnia.c/o increased stress Denies skin break down or rash.   PE  BP 118/82   Pulse 99   Resp 16   Ht 5\' 3"  (1.6 m)   Wt 185 lb 1.9 oz (84 kg)   SpO2 98%   BMI 32.79 kg/m   Patient alert and oriented and in no cardiopulmonary distress.  HEENT: No facial asymmetry, EOMI,   oropharynx pink and moist.  Neck supple no JVD, no mass.  Chest: Clear to auscultation bilaterally.  CVS: S1, S2 no murmurs, no S3.Regular rate.  ABD: Soft non tender.   Ext: No edema  MS: Adequate ROM spine, shoulders, hips and knees.  Skin: Intact, no ulcerations or rash noted.  Psych: Good eye contact, normal affect. Memory intact mildly  Anxious not  depressed appearing.  CNS: CN 2-12 intact, power,  normal throughout.no focal deficits noted.   Assessment & Plan  Essential  hypertension Controlled, no change in medication DASH diet and commitment to daily physical activity for a minimum of 30 minutes discussed and encouraged, as a part of hypertension management. The importance of attaining a healthy weight is also discussed.  BP/Weight 10/12/2016 04/14/2016 09/23/2015 03/11/2015 08/29/2014 02/28/2014 09/27/2013  Systolic BP 118 118 124 128 124 140 138  Diastolic BP 82 82 82 84 86 92 86  Wt. (Lbs) 185.12 179 178 178.08 172 169.8 173  BMI 32.79 31.71 31.54 31.55 30.48 30.09 30.65       Allergic rhinitis Controlled, no change in medication   Hyperlipidemia LDL goal <100 Deteriorated, opts for  Non pharmacologic intervention, excellent HDL Hyperlipidemia:Low fat diet discussed and encouraged.   Lipid Panel  Lab Results  Component Value Date   CHOL 247 (H) 10/04/2016   HDL 65 10/04/2016   LDLCALC 140 (H) 10/04/2016   TRIG 211 (H) 10/04/2016   CHOLHDL 3.8 10/04/2016  Updated lab needed at/ before next visit.     Obesity (BMI 30.0-34.9) Deteriorated. Patient re-educated about  the importance of commitment to a  minimum of 150 minutes of exercise per week.  The importance of healthy food choices with portion control discussed. Encouraged to start a food diary, count calories and to consider  joining a support group. Sample diet sheets offered. Goals set by the patient for the next several months.   Weight /BMI 10/12/2016 04/14/2016 09/23/2015  WEIGHT 185 lb 1.9 oz 179 lb 178 lb  HEIGHT 5\' 3"  5\' 3"  5\' 3"   BMI 32.79 kg/m2  31.71 kg/m2 31.54 kg/m2

## 2016-10-13 NOTE — Assessment & Plan Note (Signed)
Deteriorated. Patient re-educated about  the importance of commitment to a  minimum of 150 minutes of exercise per week.  The importance of healthy food choices with portion control discussed. Encouraged to start a food diary, count calories and to consider  joining a support group. Sample diet sheets offered. Goals set by the patient for the next several months.   Weight /BMI 10/12/2016 04/14/2016 09/23/2015  WEIGHT 185 lb 1.9 oz 179 lb 178 lb  HEIGHT 5\' 3"  5\' 3"  5\' 3"   BMI 32.79 kg/m2 31.71 kg/m2 31.54 kg/m2

## 2016-10-13 NOTE — Assessment & Plan Note (Signed)
Deteriorated, opts for  Non pharmacologic intervention, excellent HDL Hyperlipidemia:Low fat diet discussed and encouraged.   Lipid Panel  Lab Results  Component Value Date   CHOL 247 (H) 10/04/2016   HDL 65 10/04/2016   LDLCALC 140 (H) 10/04/2016   TRIG 211 (H) 10/04/2016   CHOLHDL 3.8 10/04/2016  Updated lab needed at/ before next visit.

## 2016-10-14 ENCOUNTER — Ambulatory Visit: Payer: BLUE CROSS/BLUE SHIELD

## 2016-11-03 DIAGNOSIS — Z1283 Encounter for screening for malignant neoplasm of skin: Secondary | ICD-10-CM | POA: Diagnosis not present

## 2016-11-03 DIAGNOSIS — D225 Melanocytic nevi of trunk: Secondary | ICD-10-CM | POA: Diagnosis not present

## 2017-03-28 DIAGNOSIS — E785 Hyperlipidemia, unspecified: Secondary | ICD-10-CM | POA: Diagnosis not present

## 2017-03-28 DIAGNOSIS — I10 Essential (primary) hypertension: Secondary | ICD-10-CM | POA: Diagnosis not present

## 2017-03-28 LAB — LIPID PANEL
CHOLESTEROL: 241 mg/dL — AB (ref <200)
HDL: 69 mg/dL (ref >50)
LDL Cholesterol: 138 mg/dL — ABNORMAL HIGH (ref <100)
TRIGLYCERIDES: 171 mg/dL — AB (ref <150)
Total CHOL/HDL Ratio: 3.5 Ratio (ref <5.0)
VLDL: 34 mg/dL — ABNORMAL HIGH (ref <30)

## 2017-03-28 LAB — BASIC METABOLIC PANEL
BUN: 14 mg/dL (ref 7–25)
CALCIUM: 9 mg/dL (ref 8.6–10.2)
CHLORIDE: 99 mmol/L (ref 98–110)
CO2: 26 mmol/L (ref 20–31)
CREATININE: 0.6 mg/dL (ref 0.50–1.10)
Glucose, Bld: 112 mg/dL — ABNORMAL HIGH (ref 65–99)
Potassium: 3.7 mmol/L (ref 3.5–5.3)
Sodium: 137 mmol/L (ref 135–146)

## 2017-04-04 ENCOUNTER — Ambulatory Visit (INDEPENDENT_AMBULATORY_CARE_PROVIDER_SITE_OTHER): Payer: BLUE CROSS/BLUE SHIELD | Admitting: Family Medicine

## 2017-04-04 ENCOUNTER — Encounter: Payer: Self-pay | Admitting: Family Medicine

## 2017-04-04 VITALS — BP 126/74 | HR 91 | Temp 99.4°F | Resp 16 | Ht 62.0 in | Wt 185.0 lb

## 2017-04-04 DIAGNOSIS — I10 Essential (primary) hypertension: Secondary | ICD-10-CM

## 2017-04-04 DIAGNOSIS — E669 Obesity, unspecified: Secondary | ICD-10-CM | POA: Diagnosis not present

## 2017-04-04 DIAGNOSIS — J302 Other seasonal allergic rhinitis: Secondary | ICD-10-CM | POA: Diagnosis not present

## 2017-04-04 DIAGNOSIS — E785 Hyperlipidemia, unspecified: Secondary | ICD-10-CM

## 2017-04-04 NOTE — Progress Notes (Signed)
Carrie BillSandra Mcintyre     MRN: [id number]      DOB: [birth year]   HPI [name] is here for follow up and re-evaluation of chronic medical conditions, medication management and review of any available recent lab and radiology data.  Preventive health is updated, specifically  Cancer screening and Immunization.   Questions or concerns regarding consultations or procedures which the PT has had in the interim are  addressed. The PT denies any adverse reactions to current medications since the last visit.  There are no new concerns.  There are no specific complaints   ROS Denies recent fever or chills. Denies sinus pressure, nasal congestion, ear pain or sore throat. Denies chest congestion, productive cough or wheezing. Denies chest pains, palpitations and leg swelling Denies abdominal pain, nausea, vomiting,diarrhea or constipation.   Denies dysuria, frequency, hesitancy or incontinence. Denies joint pain, swelling and limitation in mobility. Denies headaches, seizures, numbness, or tingling. Denies depression, anxiety or insomnia. Denies skin break down or rash.   PE  BP 126/74 (BP Location: Left Arm, Patient Position: Sitting, Cuff Size: Normal)   Pulse 91   Temp 99.4 F (37.4 C) (Other (Comment))   Resp 16   Ht 5\' 2"  (1.575 m)   Wt 185 lb (83.9 kg)   LMP 11/05/2016 (Approximate)   SpO2 97%   BMI 33.84 kg/m   Patient alert and oriented and in no cardiopulmonary distress.  HEENT: No facial asymmetry, EOMI,   oropharynx pink and moist.  Neck supple no JVD, no mass.  Chest: Clear to auscultation bilaterally.  CVS: S1, S2 no murmurs, no S3.Regular rate.  ABD: Soft non tender.   Ext: No edema  MS: Adequate ROM spine, shoulders, hips and knees.  Skin: Intact, no ulcerations or rash noted.  Psych: Good eye contact, normal affect. Memory intact not anxious or depressed appearing.  CNS: CN 2-12 intact, power,  normal throughout.no focal deficits noted.   Assessment &  Plan  Essential hypertension Controlled, no change in medication DASH diet and commitment to daily physical activity for a minimum of 30 minutes discussed and encouraged, as a part of hypertension management. The importance of attaining a healthy weight is also discussed.  BP/Weight 04/04/2017 10/12/2016 04/14/2016 09/23/2015 03/11/2015 08/29/2014 02/28/2014  Systolic BP 126 118 118 124 128 124 140  Diastolic BP 74 82 82 82 84 86 92  Wt. (Lbs) 185 185.12 179 178 178.08 172 169.8  BMI 33.84 32.79 31.71 31.54 31.55 30.48 30.09       Allergic rhinitis No recent flare Stable and controlled  Obesity (BMI 30.0-34.9) unchanged Patient re-educated about  the importance of commitment to a  minimum of 150 minutes of exercise per week.  The importance of healthy food choices with portion control discussed. Encouraged to start a food diary, count calories and to consider  joining a support group. Sample diet sheets offered. Goals set by the patient for the next several months.   Weight /BMI 04/04/2017 10/12/2016 04/14/2016  WEIGHT 185 lb 185 lb 1.9 oz 179 lb  HEIGHT 5\' 2"  5\' 3"  5\' 3"   BMI 33.84 kg/m2 32.79 kg/m2 31.71 kg/m2      Hyperlipidemia LDL goal <100 Improved, but still not at goal, continue dietary change Hyperlipidemia:Low fat diet discussed and encouraged.   Lipid Panel  Lab Results  Component Value Date   CHOL 241 (H) 03/28/2017   HDL 69 03/28/2017   LDLCALC 138 (H) 03/28/2017   TRIG 171 (H) 03/28/2017   CHOLHDL 3.5  03/28/2017        

## 2017-04-04 NOTE — Assessment & Plan Note (Signed)
Controlled, no change in medication DASH diet and commitment to daily physical activity for a minimum of 30 minutes discussed and encouraged, as a part of hypertension management. The importance of attaining a healthy weight is also discussed.  BP/Weight 04/04/2017 10/12/2016 04/14/2016 09/23/2015 03/11/2015 08/29/2014 02/28/2014  Systolic BP 126 118 118 124 128 124 140  Diastolic BP 74 82 82 82 84 86 92  Wt. (Lbs) 185 185.12 179 178 178.08 172 169.8  BMI 33.84 32.79 31.71 31.54 31.55 30.48 30.09

## 2017-04-04 NOTE — Patient Instructions (Signed)
F/u in 6 month, call if you need me before  Pls send a message for lab order in December  Pls call your ins re support available for help with lifestyle change   Congrats on improvement with triglycerides  It is important that you exercise regularly at least 30 minutes 5 times a week. If you develop chest pain, have severe difficulty breathing, or feel very tired, stop exercising immediately and seek medical attention    Pls return stool cards August 15 or after  Mammogram is due in December please get this done  Keep skin appt as usual  It is important that you exercise regularly at least 30 minutes 5 times a week. If you develop chest pain, have severe difficulty breathing, or feel very tired, stop exercising immediately and seek medical attention

## 2017-04-04 NOTE — Assessment & Plan Note (Signed)
No recent flare Stable and controlled

## 2017-04-04 NOTE — Assessment & Plan Note (Signed)
unchanged Patient re-educated about  the importance of commitment to a  minimum of 150 minutes of exercise per week.  The importance of healthy food choices with portion control discussed. Encouraged to start a food diary, count calories and to consider  joining a support group. Sample diet sheets offered. Goals set by the patient for the next several months.   Weight /BMI 04/04/2017 10/12/2016 04/14/2016  WEIGHT 185 lb 185 lb 1.9 oz 179 lb  HEIGHT 5\' 2"  5\' 3"  5\' 3"   BMI 33.84 kg/m2 32.79 kg/m2 31.71 kg/m2

## 2017-04-05 ENCOUNTER — Ambulatory Visit: Payer: BLUE CROSS/BLUE SHIELD | Admitting: Family Medicine

## 2017-04-09 NOTE — Assessment & Plan Note (Signed)
Improved, but still not at goal, continue dietary change Hyperlipidemia:Low fat diet discussed and encouraged.   Lipid Panel  Lab Results  Component Value Date   CHOL 241 (H) 03/28/2017   HDL 69 03/28/2017   LDLCALC 138 (H) 03/28/2017   TRIG 171 (H) 03/28/2017   CHOLHDL 3.5 03/28/2017

## 2017-06-21 ENCOUNTER — Other Ambulatory Visit: Payer: Self-pay | Admitting: Family Medicine

## 2017-06-21 NOTE — Telephone Encounter (Signed)
Seen 7 24 18 

## 2017-08-17 DIAGNOSIS — L820 Inflamed seborrheic keratosis: Secondary | ICD-10-CM | POA: Diagnosis not present

## 2017-08-17 DIAGNOSIS — D225 Melanocytic nevi of trunk: Secondary | ICD-10-CM | POA: Diagnosis not present

## 2017-08-17 DIAGNOSIS — Z1283 Encounter for screening for malignant neoplasm of skin: Secondary | ICD-10-CM | POA: Diagnosis not present

## 2017-09-07 ENCOUNTER — Telehealth: Payer: Self-pay

## 2017-09-07 DIAGNOSIS — I10 Essential (primary) hypertension: Secondary | ICD-10-CM

## 2017-09-07 DIAGNOSIS — E785 Hyperlipidemia, unspecified: Secondary | ICD-10-CM

## 2017-09-07 DIAGNOSIS — E8881 Metabolic syndrome: Secondary | ICD-10-CM

## 2017-09-07 NOTE — Telephone Encounter (Signed)
Lab order sent

## 2017-09-07 NOTE — Telephone Encounter (Signed)
-----   Message from Kerri PerchesMargaret E Simpson, MD sent at 07/18/2017  9:24 PM EST ----- Regarding: pls mail to p lab order to be drawn 1 week before her appt CBC, lipid, cmp and TSH fasting

## 2017-09-20 ENCOUNTER — Other Ambulatory Visit: Payer: Self-pay | Admitting: Family Medicine

## 2017-09-20 DIAGNOSIS — Z1231 Encounter for screening mammogram for malignant neoplasm of breast: Secondary | ICD-10-CM

## 2017-10-10 ENCOUNTER — Ambulatory Visit
Admission: RE | Admit: 2017-10-10 | Discharge: 2017-10-10 | Disposition: A | Discharge status: Home / Self Care | Payer: BLUE CROSS/BLUE SHIELD | Source: Ambulatory Visit | Attending: Family Medicine | Admitting: Family Medicine

## 2017-10-10 DIAGNOSIS — Z1231 Encounter for screening mammogram for malignant neoplasm of breast: Secondary | ICD-10-CM

## 2017-10-17 ENCOUNTER — Ambulatory Visit: Payer: BLUE CROSS/BLUE SHIELD | Admitting: Family Medicine

## 2017-10-17 DIAGNOSIS — E8881 Metabolic syndrome: Secondary | ICD-10-CM | POA: Diagnosis not present

## 2017-10-17 DIAGNOSIS — I10 Essential (primary) hypertension: Secondary | ICD-10-CM | POA: Diagnosis not present

## 2017-10-17 LAB — LIPID PANEL
CHOLESTEROL: 243 mg/dL — AB (ref <200)
HDL: 61 mg/dL (ref >50)
LDL Cholesterol (Calc): 152 mg/dL (calc) — ABNORMAL HIGH
NON-HDL CHOLESTEROL (CALC): 182 mg/dL — AB (ref <130)
Total CHOL/HDL Ratio: 4 (calc) (ref <5.0)
Triglycerides: 166 mg/dL — ABNORMAL HIGH (ref <150)

## 2017-10-17 LAB — COMPREHENSIVE METABOLIC PANEL
AG Ratio: 1.7 (calc) (ref 1.0–2.5)
ALBUMIN MSPROF: 4.3 g/dL (ref 3.6–5.1)
ALKALINE PHOSPHATASE (APISO): 44 U/L (ref 33–115)
ALT: 40 U/L — ABNORMAL HIGH (ref 6–29)
AST: 35 U/L (ref 10–35)
BILIRUBIN TOTAL: 1.2 mg/dL (ref 0.2–1.2)
BUN: 10 mg/dL (ref 7–25)
CHLORIDE: 104 mmol/L (ref 98–110)
CO2: 29 mmol/L (ref 20–32)
CREATININE: 0.72 mg/dL (ref 0.50–1.10)
Calcium: 9.4 mg/dL (ref 8.6–10.2)
GLOBULIN: 2.6 g/dL (ref 1.9–3.7)
Glucose, Bld: 110 mg/dL — ABNORMAL HIGH (ref 65–99)
POTASSIUM: 3.8 mmol/L (ref 3.5–5.3)
SODIUM: 139 mmol/L (ref 135–146)
TOTAL PROTEIN: 6.9 g/dL (ref 6.1–8.1)

## 2017-10-17 LAB — CBC
HCT: 36.7 % (ref 35.0–45.0)
Hemoglobin: 13.2 g/dL (ref 11.7–15.5)
MCH: 31 pg (ref 27.0–33.0)
MCHC: 36 g/dL (ref 32.0–36.0)
MCV: 86.2 fL (ref 80.0–100.0)
MPV: 10.4 fL (ref 7.5–12.5)
Platelets: 192 10*3/uL (ref 140–400)
RBC: 4.26 10*6/uL (ref 3.80–5.10)
RDW: 12.3 % (ref 11.0–15.0)
WBC: 3.5 10*3/uL — AB (ref 3.8–10.8)

## 2017-10-17 LAB — TSH: TSH: 1.65 m[IU]/L

## 2017-11-07 ENCOUNTER — Ambulatory Visit: Payer: BLUE CROSS/BLUE SHIELD | Admitting: Family Medicine

## 2017-11-07 ENCOUNTER — Encounter: Payer: Self-pay | Admitting: Family Medicine

## 2017-11-07 VITALS — BP 130/84 | HR 83 | Resp 16 | Ht 62.0 in | Wt 187.0 lb

## 2017-11-07 DIAGNOSIS — I10 Essential (primary) hypertension: Secondary | ICD-10-CM

## 2017-11-07 DIAGNOSIS — E669 Obesity, unspecified: Secondary | ICD-10-CM | POA: Diagnosis not present

## 2017-11-07 DIAGNOSIS — E785 Hyperlipidemia, unspecified: Secondary | ICD-10-CM

## 2017-11-07 DIAGNOSIS — Z1211 Encounter for screening for malignant neoplasm of colon: Secondary | ICD-10-CM

## 2017-11-07 LAB — HEMOCCULT GUIAC POC 1CARD (OFFICE)
Card #2 Fecal Occult Blod, POC: NEGATIVE
FECAL OCCULT BLD: NEGATIVE
FECAL OCCULT BLD: NEGATIVE

## 2017-11-07 NOTE — Patient Instructions (Addendum)
Physical exam in Septmeber , call if you need me before   We will mail fasting lab order to you due 1 week before visit  Please DO cut out the pizza, and flour products  PLANTS are good to eat  Water ONLY!!! ' UNSWEETENED drink  It is important that you exercise regularly at least 30 minutes 5 times a week. If you develop chest pain, have severe difficulty breathing, or feel very tired, stop exercising immediately and seek medical attention    Sleep no tech  Aim at least 6hrs per night

## 2017-11-10 DIAGNOSIS — Z1211 Encounter for screening for malignant neoplasm of colon: Secondary | ICD-10-CM | POA: Insufficient documentation

## 2017-11-10 NOTE — Assessment & Plan Note (Signed)
Controlled, no change in medication DASH diet and commitment to daily physical activity for a minimum of 30 minutes discussed and encouraged, as a part of hypertension management. The importance of attaining a healthy weight is also discussed.  BP/Weight 11/07/2017 04/04/2017 10/12/2016 04/14/2016 09/23/2015 03/11/2015 08/29/2014  Systolic BP 130 126 118 118 124 128 124  Diastolic BP 84 74 82 82 82 84 86  Wt. (Lbs) 187 185 185.12 179 178 178.08 172  BMI 34.2 33.84 32.79 31.71 31.54 31.55 30.48

## 2017-11-10 NOTE — Assessment & Plan Note (Signed)
Deteriorated Hyperlipidemia:Low fat diet discussed and encouraged.   Lipid Panel  Lab Results  Component Value Date   CHOL 243 (H) 10/17/2017   HDL 61 10/17/2017   LDLCALC 138 (H) 03/28/2017   TRIG 166 (H) 10/17/2017   CHOLHDL 4.0 10/17/2017

## 2017-11-10 NOTE — Assessment & Plan Note (Signed)
3 stool cards are brought to visit for testing, all 3 are negative for occult blood

## 2017-11-10 NOTE — Progress Notes (Signed)
   Carrie Mcintyre     MRN: [id number]      DOB: 03-Nov-1970   HPI [name] is here for follow up and re-evaluation of chronic medical conditions, medication management and review of any available recent lab and radiology data.  Preventive health is updated, specifically  Cancer screening and Immunization.   Questions or concerns regarding consultations or procedures which the PT has had in the interim are  addressed. The PT denies any adverse reactions to current medications since the last visit.  There are no new concerns.  There are no specific complaints   ROS Denies recent fever or chills. Denies sinus pressure, nasal congestion, ear pain or sore throat. Denies chest congestion, productive cough or wheezing. Denies chest pains, palpitations and leg swelling Denies abdominal pain, nausea, vomiting,diarrhea or constipation.   Denies dysuria, frequency, hesitancy or incontinence. Denies joint pain, swelling and limitation in mobility. Denies headaches, seizures, numbness, or tingling. Denies depression, anxiety or insomnia. Denies skin break down or rash.   PE  BP 130/84   Pulse 83   Resp 16   Ht 5\' 2"  (1.575 m)   Wt 187 lb (84.8 kg)   SpO2 99%   BMI 34.20 kg/m   Patient alert and oriented and in no cardiopulmonary distress.  HEENT: No facial asymmetry, EOMI,   oropharynx pink and moist.  Neck supple no JVD, no mass.  Chest: Clear to auscultation bilaterally.  CVS: S1, S2 no murmurs, no S3.Regular rate.  ABD: Soft non tender.   Ext: No edema  MS: Adequate ROM spine, shoulders, hips and knees.  Skin: Intact, no ulcerations or rash noted.  Psych: Good eye contact, normal affect. Memory intact not anxious or depressed appearing.  CNS: CN 2-12 intact, power,  normal throughout.no focal deficits noted.   Assessment & Plan  Essential hypertension Controlled, no change in medication DASH diet and commitment to daily physical activity for a minimum of 30  minutes discussed and encouraged, as a part of hypertension management. The importance of attaining a healthy weight is also discussed.  BP/Weight 11/07/2017 04/04/2017 10/12/2016 04/14/2016 09/23/2015 03/11/2015 08/29/2014  Systolic BP 130 126 118 118 124 128 124  Diastolic BP 84 74 82 82 82 84 86  Wt. (Lbs) 187 185 185.12 179 178 178.08 172  BMI 34.2 33.84 32.79 31.71 31.54 31.55 30.48       Hyperlipidemia LDL goal <100 Deteriorated Hyperlipidemia:Low fat diet discussed and encouraged.   Lipid Panel  Lab Results  Component Value Date   CHOL 243 (H) 10/17/2017   HDL 61 10/17/2017   LDLCALC 138 (H) 03/28/2017   TRIG 166 (H) 10/17/2017   CHOLHDL 4.0 10/17/2017       Obesity (BMI 30.0-34.9) Deteriorated. Patient re-educated about  the importance of commitment to a  minimum of 150 minutes of exercise per week.  The importance of healthy food choices with portion control discussed. Encouraged to start a food diary, count calories and to consider  joining a support group. Sample diet sheets offered. Goals set by the patient for the next several months.   Weight /BMI 11/07/2017 04/04/2017 10/12/2016  WEIGHT 187 lb 185 lb 185 lb 1.9 oz  HEIGHT 5\' 2"  5\' 2"  5\' 3"   BMI 34.2 kg/m2 33.84 kg/m2 32.79 kg/m2      Special screening for malignant neoplasm of colon 3 stool cards are brought to visit for testing, all 3 are negative for occult blood

## 2017-11-10 NOTE — Assessment & Plan Note (Signed)
Deteriorated. Patient re-educated about  the importance of commitment to a  minimum of 150 minutes of exercise per week.  The importance of healthy food choices with portion control discussed. Encouraged to start a food diary, count calories and to consider  joining a support group. Sample diet sheets offered. Goals set by the patient for the next several months.   Weight /BMI 11/07/2017 04/04/2017 10/12/2016  WEIGHT 187 lb 185 lb 185 lb 1.9 oz  HEIGHT 5\' 2"  5\' 2"  5\' 3"   BMI 34.2 kg/m2 33.84 kg/m2 32.79 kg/m2

## 2018-01-24 ENCOUNTER — Other Ambulatory Visit: Payer: Self-pay | Admitting: Family Medicine

## 2018-03-02 ENCOUNTER — Encounter: Payer: Self-pay | Admitting: Family Medicine

## 2018-06-05 ENCOUNTER — Encounter: Payer: Self-pay | Admitting: Family Medicine

## 2018-06-05 ENCOUNTER — Encounter: Payer: BLUE CROSS/BLUE SHIELD | Admitting: Family Medicine

## 2018-06-12 ENCOUNTER — Telehealth: Payer: Self-pay

## 2018-06-12 DIAGNOSIS — I10 Essential (primary) hypertension: Secondary | ICD-10-CM

## 2018-06-12 DIAGNOSIS — E785 Hyperlipidemia, unspecified: Secondary | ICD-10-CM

## 2018-06-12 NOTE — Telephone Encounter (Signed)
Labs entered, printed, and mailed to patient.

## 2018-06-14 DIAGNOSIS — I10 Essential (primary) hypertension: Secondary | ICD-10-CM | POA: Diagnosis not present

## 2018-06-14 DIAGNOSIS — E785 Hyperlipidemia, unspecified: Secondary | ICD-10-CM | POA: Diagnosis not present

## 2018-06-14 LAB — LIPID PANEL
CHOLESTEROL: 255 mg/dL — AB (ref <200)
HDL: 60 mg/dL (ref >50)
LDL Cholesterol (Calc): 170 mg/dL (calc) — ABNORMAL HIGH
Non-HDL Cholesterol (Calc): 195 mg/dL (calc) — ABNORMAL HIGH (ref <130)
Total CHOL/HDL Ratio: 4.3 (calc) (ref <5.0)
Triglycerides: 121 mg/dL (ref <150)

## 2018-06-14 LAB — COMPLETE METABOLIC PANEL WITH GFR
AG Ratio: 1.7 (calc) (ref 1.0–2.5)
ALT: 20 U/L (ref 6–29)
AST: 21 U/L (ref 10–35)
Albumin: 4.5 g/dL (ref 3.6–5.1)
Alkaline phosphatase (APISO): 43 U/L (ref 33–115)
BUN: 14 mg/dL (ref 7–25)
CALCIUM: 9.5 mg/dL (ref 8.6–10.2)
CO2: 26 mmol/L (ref 20–32)
CREATININE: 0.61 mg/dL (ref 0.50–1.10)
Chloride: 101 mmol/L (ref 98–110)
GFR, EST AFRICAN AMERICAN: 123 mL/min/{1.73_m2} (ref >=60)
GFR, EST NON AFRICAN AMERICAN: 106 mL/min/{1.73_m2} (ref >=60)
GLOBULIN: 2.6 g/dL (ref 1.9–3.7)
Glucose, Bld: 116 mg/dL — ABNORMAL HIGH (ref 65–99)
POTASSIUM: 4 mmol/L (ref 3.5–5.3)
SODIUM: 136 mmol/L (ref 135–146)
TOTAL PROTEIN: 7.1 g/dL (ref 6.1–8.1)
Total Bilirubin: 1.7 mg/dL — ABNORMAL HIGH (ref 0.2–1.2)

## 2018-06-15 ENCOUNTER — Encounter: Payer: Self-pay | Admitting: Family Medicine

## 2018-06-19 ENCOUNTER — Encounter: Payer: Self-pay | Admitting: Family Medicine

## 2018-06-19 ENCOUNTER — Ambulatory Visit (INDEPENDENT_AMBULATORY_CARE_PROVIDER_SITE_OTHER): Payer: BLUE CROSS/BLUE SHIELD | Admitting: Family Medicine

## 2018-06-19 ENCOUNTER — Other Ambulatory Visit (HOSPITAL_COMMUNITY)
Admission: RE | Admit: 2018-06-19 | Discharge: 2018-06-19 | Disposition: A | Discharge status: Home / Self Care | Payer: BLUE CROSS/BLUE SHIELD | Source: Ambulatory Visit | Attending: Family Medicine | Admitting: Family Medicine

## 2018-06-19 VITALS — BP 134/84 | HR 78 | Resp 15 | Ht 62.0 in | Wt 179.0 lb

## 2018-06-19 DIAGNOSIS — E8881 Metabolic syndrome: Secondary | ICD-10-CM

## 2018-06-19 DIAGNOSIS — Z0000 Encounter for general adult medical examination without abnormal findings: Secondary | ICD-10-CM

## 2018-06-19 DIAGNOSIS — Z124 Encounter for screening for malignant neoplasm of cervix: Secondary | ICD-10-CM

## 2018-06-19 DIAGNOSIS — E785 Hyperlipidemia, unspecified: Secondary | ICD-10-CM | POA: Diagnosis not present

## 2018-06-19 DIAGNOSIS — I10 Essential (primary) hypertension: Secondary | ICD-10-CM

## 2018-06-19 DIAGNOSIS — Z23 Encounter for immunization: Secondary | ICD-10-CM

## 2018-06-19 NOTE — Patient Instructions (Addendum)
F/U early March , call if you ned me  Sooner  Fasting lipid, cmp and EGFR, and hBa1Cm,, TSH and CBC 1 week before follow up  Please work on low fat diet, the challenge is ON! Liver test is better, great!  It is important that you exercise regularly at least 30 minutes 5 times a week. If you develop chest pain, have severe difficulty breathing, or feel very tired, stop exercising immediately and seek medical attention     Eat clean and green    Congrats on weight loss, be aware that your blood sugar and cholesterol are both too high so continue to be even more diligent  Please sched mammogram due mid January  Pap today

## 2018-06-22 ENCOUNTER — Encounter: Payer: Self-pay | Admitting: Family Medicine

## 2018-06-22 DIAGNOSIS — Z23 Encounter for immunization: Secondary | ICD-10-CM | POA: Insufficient documentation

## 2018-06-22 LAB — CYTOLOGY - PAP
Adequacy: ABSENT
Diagnosis: NEGATIVE
HPV: NOT DETECTED

## 2018-06-22 NOTE — Assessment & Plan Note (Signed)
After obtaining informed consent, the vaccine is  administered by LPN.  

## 2018-06-22 NOTE — Assessment & Plan Note (Signed)
Annual exam as documented. Counseling done  re healthy lifestyle involving commitment to 150 minutes exercise per week, heart healthy diet, and attaining healthy weight.The importance of adequate sleep also discussed. Changes in health habits are decided on by the patient with goals and time frames  set for achieving them. Immunization and cancer screening needs are specifically addressed at this visit. 

## 2018-06-22 NOTE — Progress Notes (Signed)
    Carrie Mcintyre     MRN: [id number]      DOB: 09/09/1971  HPI: Patient is in for annual physical exam.  Recent labs,  are reviewed.Lipids are worse, pt continues to refuse statin therapy Immunization is reviewed , and  Updated.   PE: BP 134/84   Pulse 78   Resp 15   Ht 5\' 2"  (1.575 m)   Wt 179 lb (81.2 kg)   LMP 05/27/2018 (Approximate)   BMI 32.74 kg/m   Pleasant  female, alert and oriented x 3, in no cardio-pulmonary distress. Afebrile. HEENT No facial trauma or asymetry. Sinuses non tender.  Extra occullar muscles intact, pupils equally reactive to light. External ears normal, tympanic membranes clear. Oropharynx moist, no exudate. Neck: supple, no adenopathy,JVD or thyromegaly.No bruits.  Chest: Clear to ascultation bilaterally.No crackles or wheezes. Non tender to palpation  Breast: No asymetry,no masses or lumps. No tenderness. No nipple discharge or inversion. No axillary or supraclavicular adenopathy  Cardiovascular system; Heart sounds normal,  S1 and  S2 ,no S3.  No murmur, or thrill. Apical beat not displaced Peripheral pulses normal.  Abdomen: Soft, non tender, no organomegaly or masses. No bruits. Bowel sounds normal. No guarding, tenderness or rebound.   GU: External genitalia normal female genitalia , normal female distribution of hair. No lesions. Urethral meatus normal in size, no  Prolapse, no lesions visibly  Present. Bladder non tender. Vagina pink and moist , with no visible lesions , discharge present . Adequate pelvic support no  cystocele or rectocele noted Cervix pink and appears healthy, no lesions or ulcerations noted, no discharge noted from os Uterus normal size, no adnexal masses, no cervical motion or adnexal tenderness.   Musculoskeletal exam: Full ROM of spine, hips , shoulders and knees. No deformity ,swelling or crepitus noted. No muscle wasting or atrophy.   Neurologic: Cranial nerves 2 to 12 intact. Power, tone  ,sensation and reflexes normal throughout. No disturbance in gait. No tremor.  Skin: Intact, no ulceration, erythema , scaling or rash noted. Multiple nevi  Psych; Normal mood and affect. Judgement and concentration normal   Assessment & Plan:  Annual physical exam Annual exam as documented. Counseling done  re healthy lifestyle involving commitment to 150 minutes exercise per week, heart healthy diet, and attaining healthy weight.The importance of adequate sleep also discussed. Changes in health habits are decided on by the patient with goals and time frames  set for achieving them. Immunization and cancer screening needs are specifically addressed at this visit.   Need for immunization against influenza After obtaining informed consent, the vaccine is  administered by LPN.

## 2018-07-19 DIAGNOSIS — L821 Other seborrheic keratosis: Secondary | ICD-10-CM | POA: Diagnosis not present

## 2018-07-19 DIAGNOSIS — L818 Other specified disorders of pigmentation: Secondary | ICD-10-CM | POA: Diagnosis not present

## 2018-07-19 DIAGNOSIS — D1801 Hemangioma of skin and subcutaneous tissue: Secondary | ICD-10-CM | POA: Diagnosis not present

## 2018-07-19 DIAGNOSIS — D225 Melanocytic nevi of trunk: Secondary | ICD-10-CM | POA: Diagnosis not present

## 2018-08-28 ENCOUNTER — Other Ambulatory Visit: Payer: Self-pay | Admitting: Family Medicine

## 2018-08-28 DIAGNOSIS — Z1231 Encounter for screening mammogram for malignant neoplasm of breast: Secondary | ICD-10-CM

## 2018-09-02 ENCOUNTER — Other Ambulatory Visit: Payer: Self-pay | Admitting: Family Medicine

## 2018-10-16 ENCOUNTER — Ambulatory Visit
Admission: RE | Admit: 2018-10-16 | Discharge: 2018-10-16 | Disposition: A | Discharge status: Home / Self Care | Payer: BLUE CROSS/BLUE SHIELD | Source: Ambulatory Visit | Attending: Family Medicine | Admitting: Family Medicine

## 2018-10-16 DIAGNOSIS — Z1231 Encounter for screening mammogram for malignant neoplasm of breast: Secondary | ICD-10-CM

## 2018-11-20 ENCOUNTER — Ambulatory Visit: Payer: BLUE CROSS/BLUE SHIELD | Admitting: Family Medicine

## 2018-11-29 ENCOUNTER — Encounter: Payer: Self-pay | Admitting: Family Medicine

## 2018-12-01 ENCOUNTER — Encounter: Payer: Self-pay | Admitting: Family Medicine

## 2018-12-02 ENCOUNTER — Other Ambulatory Visit: Payer: Self-pay | Admitting: Family Medicine

## 2018-12-02 MED ORDER — FLUTICASONE PROPIONATE 50 MCG/ACT NA SUSP: 2.0000 | Freq: Every day | NASAL | 6 refills | Status: DC

## 2018-12-20 ENCOUNTER — Ambulatory Visit: Payer: BLUE CROSS/BLUE SHIELD | Admitting: Family Medicine

## 2019-02-06 ENCOUNTER — Telehealth: Payer: Self-pay

## 2019-02-06 DIAGNOSIS — E785 Hyperlipidemia, unspecified: Secondary | ICD-10-CM

## 2019-02-06 DIAGNOSIS — R7301 Impaired fasting glucose: Secondary | ICD-10-CM

## 2019-02-06 DIAGNOSIS — I10 Essential (primary) hypertension: Secondary | ICD-10-CM

## 2019-02-06 NOTE — Telephone Encounter (Signed)
Labs re-ordered

## 2019-02-21 ENCOUNTER — Ambulatory Visit: Payer: BLUE CROSS/BLUE SHIELD | Admitting: Family Medicine

## 2019-03-14 DIAGNOSIS — R7301 Impaired fasting glucose: Secondary | ICD-10-CM | POA: Diagnosis not present

## 2019-03-14 DIAGNOSIS — I10 Essential (primary) hypertension: Secondary | ICD-10-CM | POA: Diagnosis not present

## 2019-03-14 DIAGNOSIS — E785 Hyperlipidemia, unspecified: Secondary | ICD-10-CM | POA: Diagnosis not present

## 2019-03-15 ENCOUNTER — Encounter: Payer: Self-pay | Admitting: Family Medicine

## 2019-03-15 LAB — COMPLETE METABOLIC PANEL WITH GFR
AG Ratio: 1.7 (calc) (ref 1.0–2.5)
ALT: 19 U/L (ref 6–29)
AST: 20 U/L (ref 10–35)
Albumin: 4.5 g/dL (ref 3.6–5.1)
Alkaline phosphatase (APISO): 41 U/L (ref 31–125)
BUN: 14 mg/dL (ref 7–25)
CO2: 28 mmol/L (ref 20–32)
Calcium: 9.2 mg/dL (ref 8.6–10.2)
Chloride: 104 mmol/L (ref 98–110)
Creat: 0.58 mg/dL (ref 0.50–1.10)
GFR, Est African American: 125 mL/min/{1.73_m2} (ref >=60)
GFR, Est Non African American: 108 mL/min/{1.73_m2} (ref >=60)
Globulin: 2.7 g/dL (calc) (ref 1.9–3.7)
Glucose, Bld: 108 mg/dL — ABNORMAL HIGH (ref 65–99)
Potassium: 3.7 mmol/L (ref 3.5–5.3)
Sodium: 139 mmol/L (ref 135–146)
Total Bilirubin: 1.3 mg/dL — ABNORMAL HIGH (ref 0.2–1.2)
Total Protein: 7.2 g/dL (ref 6.1–8.1)

## 2019-03-15 LAB — TSH: TSH: 1.92 mIU/L

## 2019-03-15 LAB — CBC
HCT: 40.3 % (ref 35.0–45.0)
Hemoglobin: 13.9 g/dL (ref 11.7–15.5)
MCH: 30.3 pg (ref 27.0–33.0)
MCHC: 34.5 g/dL (ref 32.0–36.0)
MCV: 88 fL (ref 80.0–100.0)
MPV: 10.8 fL (ref 7.5–12.5)
Platelets: 183 10*3/uL (ref 140–400)
RBC: 4.58 10*6/uL (ref 3.80–5.10)
RDW: 12.3 % (ref 11.0–15.0)
WBC: 4.1 10*3/uL (ref 3.8–10.8)

## 2019-03-15 LAB — LIPID PANEL
Cholesterol: 230 mg/dL — ABNORMAL HIGH (ref <200)
HDL: 58 mg/dL (ref >=50)
LDL Cholesterol (Calc): 149 mg/dL (calc) — ABNORMAL HIGH
Non-HDL Cholesterol (Calc): 172 mg/dL (calc) — ABNORMAL HIGH (ref <130)
Total CHOL/HDL Ratio: 4 (calc) (ref <5.0)
Triglycerides: 115 mg/dL (ref <150)

## 2019-03-15 LAB — HEMOGLOBIN A1C
Hgb A1c MFr Bld: 5 % of total Hgb (ref <5.7)
Mean Plasma Glucose: 97 (calc)
eAG (mmol/L): 5.4 (calc)

## 2019-03-20 ENCOUNTER — Other Ambulatory Visit: Payer: Self-pay

## 2019-03-20 ENCOUNTER — Ambulatory Visit (INDEPENDENT_AMBULATORY_CARE_PROVIDER_SITE_OTHER): Payer: BC Managed Care – PPO | Admitting: Family Medicine

## 2019-03-20 ENCOUNTER — Encounter: Payer: Self-pay | Admitting: Family Medicine

## 2019-03-20 VITALS — BP 138/82 | HR 84 | Resp 12 | Ht 63.0 in | Wt 180.1 lb

## 2019-03-20 DIAGNOSIS — Z1321 Encounter for screening for nutritional disorder: Secondary | ICD-10-CM

## 2019-03-20 DIAGNOSIS — Z1211 Encounter for screening for malignant neoplasm of colon: Secondary | ICD-10-CM | POA: Diagnosis not present

## 2019-03-20 DIAGNOSIS — Z808 Family history of malignant neoplasm of other organs or systems: Secondary | ICD-10-CM

## 2019-03-20 DIAGNOSIS — J302 Other seasonal allergic rhinitis: Secondary | ICD-10-CM

## 2019-03-20 DIAGNOSIS — E785 Hyperlipidemia, unspecified: Secondary | ICD-10-CM | POA: Diagnosis not present

## 2019-03-20 DIAGNOSIS — I10 Essential (primary) hypertension: Secondary | ICD-10-CM

## 2019-03-20 DIAGNOSIS — E669 Obesity, unspecified: Secondary | ICD-10-CM

## 2019-03-20 NOTE — Assessment & Plan Note (Signed)
Improved, applauded on this and encouraged to continue change in food choice Hyperlipidemia:Low fat diet discussed and encouraged.   Lipid Panel  Lab Results  Component Value Date   CHOL 230 (H) 03/14/2019   HDL 58 03/14/2019   LDLCALC 149 (H) 03/14/2019   TRIG 115 03/14/2019   CHOLHDL 4.0 03/14/2019

## 2019-03-20 NOTE — Assessment & Plan Note (Signed)
  Patient re-educated about  the importance of commitment to a  minimum of 150 minutes of exercise per week as able.  The importance of healthy food choices with portion control discussed, as well as eating regularly and within a 12 hour window most days. The need to choose "clean , green" food 50 to 75% of the time is discussed, as well as to make water the primary drink and set a goal of 64 ounces water daily.    Weight /BMI 03/20/2019 06/19/2018 11/07/2017  WEIGHT 180 lb 1.3 oz 179 lb 187 lb  HEIGHT 5\' 6"  5\' 2"  5\' 2"   BMI 29.07 kg/m2 32.74 kg/m2 34.2 kg/m2

## 2019-03-20 NOTE — Progress Notes (Signed)
Carrie Mcintyre     MRN: [id number]      DOB: [birth year]   HPI [name] is here for follow up and re-evaluation of chronic medical conditions, medication management and review of any available recent lab and radiology data.  Preventive health is updated, specifically  Cancer screening and Immunization.   Questions or concerns regarding consultations or procedures which the PT has had in the interim are  addressed. The PT denies any adverse reactions to current medications since the last visit.  There are no new concerns.  There are no specific complaints  Opts for cologuard and will start shingrix in the near future Has dermatology annual exam upcoming. Uses sunscreen SPF 70, and avoids excess exposure  ROS Denies recent fever or chills. Denies sinus pressure, nasal congestion, ear pain or sore throat. Denies chest congestion, productive cough or wheezing. Denies chest pains, palpitations and leg swelling Denies abdominal pain, nausea, vomiting,diarrhea or constipation.   Denies dysuria, frequency, hesitancy or incontinence. Denies joint pain, swelling and limitation in mobility. Denies headaches, seizures, numbness, or tingling. Denies depression, anxiety or insomnia. Denies skin break down or rash.   PE  BP 138/82   Pulse 84   Resp 12   Ht 5\' 3"  (1.6 m)   Wt 180 lb 1.3 oz (81.7 kg)   SpO2 98%   BMI 31.90 kg/m    Patient alert and oriented and in no cardiopulmonary distress.  HEENT: No facial asymmetry, EOMI,   oropharynx pink and moist.  Neck supple no JVD, no mass.  Chest: Clear to auscultation bilaterally.  CVS: S1, S2 no murmurs, no S3.Regular rate.  ABD: Soft non tender.   Ext: No edema  MS: Adequate ROM spine, shoulders, hips and knees.  Skin: Intact, no ulcerations or rash noted.  Psych: Good eye contact, normal affect. Memory intact not anxious or depressed appearing.  CNS: CN 2-12 intact, power,  normal throughout.no focal deficits noted.    Assessment & Plan  Essential hypertension Controlled, no change in medication DASH diet and commitment to daily physical activity for a minimum of 30 minutes discussed and encouraged, as a part of hypertension management. The importance of attaining a healthy weight is also discussed.  BP/Weight 03/20/2019 06/19/2018 11/07/2017 04/04/2017 10/12/2016 04/14/2016 09/23/2015  Systolic BP 138 134 130 126 118 118 124  Diastolic BP 82 84 84 74 82 82 82  Wt. (Lbs) 180.08 179 187 185 185.12 179 178  BMI 29.07 32.74 34.2 33.84 32.79 31.71 31.54       Obesity (BMI 30.0-34.9)  Patient re-educated about  the importance of commitment to a  minimum of 150 minutes of exercise per week as able.  The importance of healthy food choices with portion control discussed, as well as eating regularly and within a 12 hour window most days. The need to choose "clean , green" food 50 to 75% of the time is discussed, as well as to make water the primary drink and set a goal of 64 ounces water daily.    Weight /BMI 03/20/2019 06/19/2018 11/07/2017  WEIGHT 180 lb 1.3 oz 179 lb 187 lb  HEIGHT 5\' 6"  5\' 2"  5\' 2"   BMI 29.07 kg/m2 32.74 kg/m2 34.2 kg/m2      Hyperlipidemia LDL goal <100 Improved, applauded on this and encouraged to continue change in food choice Hyperlipidemia:Low fat diet discussed and encouraged.   Lipid Panel  Lab Results  Component Value Date   CHOL 230 (H) 03/14/2019   HDL 58  03/14/2019   [place] 149 (H) 03/14/2019   TRIG 115 03/14/2019   CHOLHDL 4.0 03/14/2019       FH: skin cancer Multiple freckles, no specific areas of concern Encouraged to keep annual skin exam  Allergic rhinitis Mild currently, uses medication as needed only

## 2019-03-20 NOTE — Assessment & Plan Note (Signed)
Mild currently, uses medication as needed only

## 2019-03-20 NOTE — Assessment & Plan Note (Signed)
Controlled, no change in medication DASH diet and commitment to daily physical activity for a minimum of 30 minutes discussed and encouraged, as a part of hypertension management. The importance of attaining a healthy weight is also discussed.  BP/Weight 03/20/2019 06/19/2018 11/07/2017 04/04/2017 10/12/2016 04/14/2016 0/16/5537  Systolic BP 482 707 867 544 920 100 712  Diastolic BP 82 84 84 74 82 82 82  Wt. (Lbs) 180.08 179 187 185 185.12 179 178  BMI 29.07 32.74 34.2 33.84 32.79 31.71 31.54

## 2019-03-20 NOTE — Assessment & Plan Note (Signed)
Multiple freckles, no specific areas of concern Encouraged to keep annual skin exam

## 2019-03-20 NOTE — Patient Instructions (Signed)
Annual exam in end November, no pap with MD call if you need me sooner  Cologuard will be arranged  Congrats on improved blood work   Fasting lipid, chem 7 and EGFr and Vit D 1 wek before next vsiit  No med changes

## 2019-03-27 ENCOUNTER — Other Ambulatory Visit: Payer: Self-pay | Admitting: Family Medicine

## 2019-05-30 ENCOUNTER — Encounter: Payer: Self-pay | Admitting: Family Medicine

## 2019-06-03 ENCOUNTER — Ambulatory Visit: Payer: BC Managed Care – PPO

## 2019-06-07 ENCOUNTER — Other Ambulatory Visit: Payer: Self-pay

## 2019-06-07 ENCOUNTER — Ambulatory Visit (INDEPENDENT_AMBULATORY_CARE_PROVIDER_SITE_OTHER): Payer: BC Managed Care – PPO

## 2019-06-07 VITALS — BP 130/88

## 2019-06-07 DIAGNOSIS — Z23 Encounter for immunization: Secondary | ICD-10-CM | POA: Diagnosis not present

## 2019-06-07 NOTE — Progress Notes (Signed)
Patient came in today for her first Shingrix vaccine. Tolerated well. No immediate reactions. Encouraged to call with any issues or concerns.

## 2019-06-09 ENCOUNTER — Encounter: Payer: Self-pay | Admitting: Family Medicine

## 2019-06-10 ENCOUNTER — Ambulatory Visit: Payer: BC Managed Care – PPO

## 2019-06-17 ENCOUNTER — Ambulatory Visit (INDEPENDENT_AMBULATORY_CARE_PROVIDER_SITE_OTHER): Payer: BC Managed Care – PPO

## 2019-06-17 ENCOUNTER — Other Ambulatory Visit: Payer: Self-pay

## 2019-06-17 DIAGNOSIS — Z23 Encounter for immunization: Secondary | ICD-10-CM | POA: Diagnosis not present

## 2019-07-09 DIAGNOSIS — Z1211 Encounter for screening for malignant neoplasm of colon: Secondary | ICD-10-CM | POA: Diagnosis not present

## 2019-07-09 LAB — COLOGUARD: Cologuard: NEGATIVE

## 2019-08-06 ENCOUNTER — Encounter: Payer: BC Managed Care – PPO | Admitting: Family Medicine

## 2019-09-02 ENCOUNTER — Other Ambulatory Visit: Payer: Self-pay | Admitting: Family Medicine

## 2019-09-02 DIAGNOSIS — Z1231 Encounter for screening mammogram for malignant neoplasm of breast: Secondary | ICD-10-CM

## 2019-10-07 ENCOUNTER — Other Ambulatory Visit: Payer: Self-pay

## 2019-10-07 MED ORDER — TRIAMTERENE-HCTZ 75-50 MG PO TABS: 1.0000 | ORAL_TABLET | Freq: Every day | ORAL | 1 refills | Status: DC

## 2019-10-29 ENCOUNTER — Ambulatory Visit
Admission: RE | Admit: 2019-10-29 | Discharge: 2019-10-29 | Disposition: A | Discharge status: Home / Self Care | Payer: BC Managed Care – PPO | Source: Ambulatory Visit | Attending: Family Medicine | Admitting: Family Medicine

## 2019-10-29 ENCOUNTER — Other Ambulatory Visit: Payer: Self-pay

## 2019-10-29 DIAGNOSIS — Z1231 Encounter for screening mammogram for malignant neoplasm of breast: Secondary | ICD-10-CM | POA: Diagnosis not present

## 2019-11-07 ENCOUNTER — Other Ambulatory Visit: Payer: Self-pay

## 2019-11-07 ENCOUNTER — Ambulatory Visit (INDEPENDENT_AMBULATORY_CARE_PROVIDER_SITE_OTHER): Payer: BC Managed Care – PPO

## 2019-11-07 DIAGNOSIS — Z1283 Encounter for screening for malignant neoplasm of skin: Secondary | ICD-10-CM | POA: Diagnosis not present

## 2019-11-07 DIAGNOSIS — D225 Melanocytic nevi of trunk: Secondary | ICD-10-CM | POA: Diagnosis not present

## 2019-11-07 DIAGNOSIS — Z23 Encounter for immunization: Secondary | ICD-10-CM

## 2019-11-07 NOTE — Progress Notes (Signed)
Received shingrix #2 

## 2020-01-08 DIAGNOSIS — Z23 Encounter for immunization: Secondary | ICD-10-CM | POA: Diagnosis not present

## 2020-02-05 DIAGNOSIS — Z23 Encounter for immunization: Secondary | ICD-10-CM | POA: Diagnosis not present

## 2020-05-10 DIAGNOSIS — S61052A Open bite of left thumb without damage to nail, initial encounter: Secondary | ICD-10-CM | POA: Diagnosis not present

## 2020-05-10 DIAGNOSIS — W540XXA Bitten by dog, initial encounter: Secondary | ICD-10-CM | POA: Diagnosis not present

## 2020-05-10 DIAGNOSIS — Z23 Encounter for immunization: Secondary | ICD-10-CM | POA: Diagnosis not present

## 2020-05-20 ENCOUNTER — Ambulatory Visit (INDEPENDENT_AMBULATORY_CARE_PROVIDER_SITE_OTHER): Payer: BC Managed Care – PPO | Admitting: Family Medicine

## 2020-05-20 ENCOUNTER — Encounter: Payer: Self-pay | Admitting: Family Medicine

## 2020-05-20 ENCOUNTER — Other Ambulatory Visit: Payer: Self-pay

## 2020-05-20 VITALS — BP 138/88 | HR 92 | Temp 97.2°F | Resp 16 | Ht 62.0 in | Wt 185.4 lb

## 2020-05-20 DIAGNOSIS — Z4802 Encounter for removal of sutures: Secondary | ICD-10-CM | POA: Diagnosis not present

## 2020-05-20 DIAGNOSIS — S61012A Laceration without foreign body of left thumb without damage to nail, initial encounter: Secondary | ICD-10-CM

## 2020-05-20 DIAGNOSIS — Z23 Encounter for immunization: Secondary | ICD-10-CM | POA: Diagnosis not present

## 2020-05-20 DIAGNOSIS — S61011A Laceration without foreign body of right thumb without damage to nail, initial encounter: Secondary | ICD-10-CM | POA: Diagnosis not present

## 2020-05-20 NOTE — Assessment & Plan Note (Signed)
Removed sutures from thumb injury. She had them placed at an UC in [place], [place]. Tolerated procedure well. Education on how to keep clean and dry. Still has 1 day of antibiotics left.

## 2020-05-20 NOTE — Patient Instructions (Addendum)
I appreciate the opportunity to provide you with care for your health and wellness. Today we discussed: recent thumb injury and stitches removed  Follow up: Needs CPE based on last time she had one- she will call about this  No labs or referrals today  Flu shot today- tylenol if arm gets sore  Call if thumb does not appear to be healing well or seems to be infected. Keep clean and dry  Please continue to practice social distancing to keep you, your family, and our community safe.  If you must go out, please wear a mask and practice good handwashing.  It was a pleasure to see you and I look forward to continuing to work together on your health and well-being. Please do not hesitate to call the office if you need care or have questions about your care.  Have a wonderful day and week. With Gratitude, Tereasa Coop, DNP, AGNP-BC

## 2020-05-20 NOTE — Assessment & Plan Note (Signed)

## 2020-05-20 NOTE — Progress Notes (Signed)
Subjective:  Patient ID: Carrie Mcintyre, female    DOB: June 10, 1971  Age: 49 y.o. MRN: [id number]  CC:  Chief Complaint  Patient presents with  . Acute Visit    stitch removal two stitches on thumb due to dog bite was put in by [facility] in [place] they have been in since 05-10-20. left thumb       HPI  HPI  Carrie Mcintyre today is a 49 year old female patient Dr. Griffin Dakin.  She presents today to have removal 3 stitches for an acute visit.  She got bit by a dog when she was at her sister's house on August 29.  She ended up going to an urgent care in [place] where they cleaned it out and placed the 2 sutures.  She was placed on antibiotics and ointment.  He has 1 more day of antibiotics left.  Overall she reports the pain at the site only hurts when she bends it or hits it with something. She denies having any fevers chills or signs symptoms of infection or pus drainage prior to coming in today.  Today patient denies signs and symptoms of COVID 19 infection including fever, chills, cough, shortness of breath, and headache. Past Medical, Surgical, Social History, Allergies, and Medications have been Reviewed.   Past Medical History:  Diagnosis Date  . ABNORMAL ELECTROCARDIOGRAM 03/04/2010   Qualifier: Diagnosis of  By: Moshe Cipro MD, Joycelyn Schmid    . Allergic rhinitis 04/16/2016  . FH: skin cancer 2012/03/13   Mom died of malignant melanoma age 37 in 09/2010   . Fracture   . Hyperlipidemia   . Hypertension   . Metabolic syndrome X 0/05/2329    Current Meds  Medication Sig  . fluticasone (FLONASE) 50 MCG/ACT nasal spray Place 2 sprays into both nostrils daily.  Marland Kitchen triamterene-hydrochlorothiazide (MAXZIDE) 75-50 MG tablet Take 1 tablet by mouth daily.    ROS:  Review of Systems  Constitutional: Negative.   HENT: Negative.   Eyes: Negative.   Respiratory: Negative.   Cardiovascular: Negative.   Gastrointestinal: Negative.   Genitourinary: Negative.     Musculoskeletal: Negative.   Skin: Negative.   Neurological: Negative.   Endo/Heme/Allergies: Negative.   Psychiatric/Behavioral: Negative.      Objective:   Today's Vitals: BP 138/88 (BP Location: Right Arm, Patient Position: Sitting, Cuff Size: Normal)   Pulse 92   Temp (!) 97.2 F (36.2 C) (Temporal)   Resp 16   Ht '5\' 2"'  (1.575 m)   Wt 185 lb 6.4 oz (84.1 kg)   SpO2 94%   BMI 33.91 kg/m  Vitals with BMI 05/20/2020 06/07/2019 03/20/2019  Height '5\' 2"'  - '5\' 3"'   Weight 185 lbs 6 oz - 180 lbs 1 oz  BMI 07.6 - 22.63  Systolic 335 456 256  Diastolic 88 88 82  Pulse 92 - 84     Physical Exam Vitals and nursing note reviewed.  Constitutional:      Appearance: Normal appearance. She is obese.  HENT:     Head: Normocephalic and atraumatic.     Right Ear: External ear normal.     Left Ear: External ear normal.     Mouth/Throat:     Comments: Mask in place  Eyes:     General:        Right eye: No discharge.        Left eye: No discharge.     Conjunctiva/sclera: Conjunctivae normal.  Cardiovascular:     Rate and Rhythm:  Normal rate and regular rhythm.     Pulses: Normal pulses.     Heart sounds: Normal heart sounds.  Pulmonary:     Effort: Pulmonary effort is normal.     Breath sounds: Normal breath sounds.  Musculoskeletal:     Cervical back: Normal range of motion and neck supple.     Comments: Two sutures on the anterior aspect of the left thumb, some granulated tissue forming 3 cms below sutures.  Skin:    General: Skin is warm.  Neurological:     General: No focal deficit present.     Mental Status: She is alert and oriented to person, place, and time.  Psychiatric:        Mood and Affect: Mood normal.        Behavior: Behavior normal.        Thought Content: Thought content normal.        Judgment: Judgment normal.    Procedure:  Removal of sutures from left anterior thumb. Site was cleaned with iodine and then a suture removal kit was used to remove the two  sutures. Patient tolerated it well. Shana CMA present.   Assessment   1. Laceration of left thumb without foreign body without damage to nail, initial encounter   2. Visit for suture removal   3. Need for immunization against influenza     Tests ordered Orders Placed This Encounter  Procedures  . Flu Vaccine QUAD 36+ mos IM    Plan: Please see assessment and plan per problem list above.   No orders of the defined types were placed in this encounter.   Patient to follow-up in [place], NP

## 2020-05-20 NOTE — Assessment & Plan Note (Signed)
Removed sutures from thumb injury. She had them placed at an UC in [place], [place]. Tolerated procedure well. Education on how to keep clean and dry. Still has 1 day of antibiotics left. 

## 2020-06-22 ENCOUNTER — Telehealth: Payer: Self-pay

## 2020-06-22 ENCOUNTER — Other Ambulatory Visit: Payer: Self-pay

## 2020-06-22 DIAGNOSIS — E785 Hyperlipidemia, unspecified: Secondary | ICD-10-CM

## 2020-06-22 DIAGNOSIS — E559 Vitamin D deficiency, unspecified: Secondary | ICD-10-CM

## 2020-06-22 DIAGNOSIS — I10 Essential (primary) hypertension: Secondary | ICD-10-CM

## 2020-06-22 MED ORDER — TRIAMTERENE-HCTZ 75-50 MG PO TABS: 1.0000 | ORAL_TABLET | Freq: Every day | ORAL | 1 refills | Status: DC

## 2020-06-22 NOTE — Telephone Encounter (Signed)
Pt needs Labs Reordered.  Pt needs BP medication Maxide  called to [facility]   Please call the pt to advise on both

## 2020-06-22 NOTE — Telephone Encounter (Signed)
Medication refilled. Labs reordered.

## 2020-07-09 ENCOUNTER — Encounter: Payer: Self-pay | Admitting: Family Medicine

## 2020-07-14 ENCOUNTER — Ambulatory Visit: Payer: BC Managed Care – PPO | Admitting: Family Medicine

## 2020-07-16 ENCOUNTER — Other Ambulatory Visit: Payer: Self-pay

## 2020-07-16 DIAGNOSIS — I10 Essential (primary) hypertension: Secondary | ICD-10-CM | POA: Diagnosis not present

## 2020-07-16 DIAGNOSIS — E559 Vitamin D deficiency, unspecified: Secondary | ICD-10-CM

## 2020-07-16 DIAGNOSIS — E785 Hyperlipidemia, unspecified: Secondary | ICD-10-CM | POA: Diagnosis not present

## 2020-07-18 LAB — BASIC METABOLIC PANEL WITH GFR
BUN: 12 mg/dL (ref 7–25)
CO2: 26 mmol/L (ref 20–32)
Calcium: 10 mg/dL (ref 8.6–10.4)
Chloride: 101 mmol/L (ref 98–110)
Creat: 0.62 mg/dL (ref 0.50–1.05)
GFR, Est African American: 121 mL/min/{1.73_m2} (ref >=60)
GFR, Est Non African American: 104 mL/min/{1.73_m2} (ref >=60)
Glucose, Bld: 111 mg/dL — ABNORMAL HIGH (ref 65–99)
Potassium: 4.6 mmol/L (ref 3.5–5.3)
Sodium: 138 mmol/L (ref 135–146)

## 2020-07-18 LAB — LIPID PANEL
Cholesterol: 222 mg/dL — ABNORMAL HIGH (ref <200)
HDL: 49 mg/dL — ABNORMAL LOW (ref >=50)
LDL Cholesterol (Calc): 144 mg/dL (calc) — ABNORMAL HIGH
Non-HDL Cholesterol (Calc): 173 mg/dL (calc) — ABNORMAL HIGH (ref <130)
Total CHOL/HDL Ratio: 4.5 (calc) (ref <5.0)
Triglycerides: 157 mg/dL — ABNORMAL HIGH (ref <150)

## 2020-07-18 LAB — VITAMIN D 25 HYDROXY (VIT D DEFICIENCY, FRACTURES): Vit D, 25-Hydroxy: 12 ng/mL — ABNORMAL LOW (ref 30–100)

## 2020-07-18 LAB — TEST AUTHORIZATION

## 2020-07-18 LAB — TSH: TSH: 1.6 mIU/L

## 2020-07-20 ENCOUNTER — Encounter: Payer: Self-pay | Admitting: Internal Medicine

## 2020-07-20 ENCOUNTER — Other Ambulatory Visit: Payer: Self-pay

## 2020-07-20 ENCOUNTER — Ambulatory Visit (INDEPENDENT_AMBULATORY_CARE_PROVIDER_SITE_OTHER): Payer: BC Managed Care – PPO | Admitting: Internal Medicine

## 2020-07-20 ENCOUNTER — Ambulatory Visit: Payer: BC Managed Care – PPO | Admitting: Family Medicine

## 2020-07-20 VITALS — BP 154/100 | HR 87 | Temp 98.4°F | Resp 18 | Ht 62.0 in | Wt 186.1 lb

## 2020-07-20 DIAGNOSIS — I10 Essential (primary) hypertension: Secondary | ICD-10-CM

## 2020-07-20 DIAGNOSIS — E669 Obesity, unspecified: Secondary | ICD-10-CM

## 2020-07-20 DIAGNOSIS — E559 Vitamin D deficiency, unspecified: Secondary | ICD-10-CM | POA: Diagnosis not present

## 2020-07-20 DIAGNOSIS — E785 Hyperlipidemia, unspecified: Secondary | ICD-10-CM

## 2020-07-20 MED ORDER — AMLODIPINE BESYLATE 5 MG PO TABS: 5.0000 mg | ORAL_TABLET | Freq: Every day | ORAL | 1 refills | Status: DC

## 2020-07-20 MED ORDER — VITAMIN D (ERGOCALCIFEROL) 1.25 MG (50000 UNIT) PO CAPS: 50000.0000 [IU] | ORAL_CAPSULE | ORAL | 1 refills | Status: DC

## 2020-07-20 NOTE — Progress Notes (Signed)
Established Patient Office Visit  Subjective:  Patient ID: Carrie Mcintyre, female    DOB: 03/23/1971  Age: 49 y.o. MRN: [id number]  CC:  Chief Complaint  Patient presents with  . Follow-up    follow up no complaints at this time bp elevated states its always this way when she comes into the office    HPI Carrie Mcintyre is 49 year old female with past medical history of hypertension, hyperlipidemia, obesity and vitamin D deficiency presents for follow-up of her chronic medical conditions.  Her blood pressure was 154/100.  She states that her blood pressure runs high in the doctor's office.  On repeat check, blood pressure was 152/104.  She denies any headache, dizziness, chest pain, dyspnea or palpitations.  She takes her medications regularly.  Blood test results were discussed with the patient in the detail.  Patient was counseled for diet modification.  Patient expressed understanding.  Past Medical History:  Diagnosis Date  . ABNORMAL ELECTROCARDIOGRAM 03/04/2010   Qualifier: Diagnosis of  By: Lodema Hong MD, Claris Che    . Allergic rhinitis 04/16/2016  . FH: skin cancer Mar 18, 2012   Mom died of malignant melanoma age 35 in 09/2010   . Fracture   . Hyperlipidemia   . Hypertension   . Metabolic syndrome X 02/23/2013    Past Surgical History:  Procedure Laterality Date  . Bakers cyst removed in childhood      Family History  Problem Relation Age of Onset  . Cancer Mother        melanoma  . Hypertension Father   . Diabetes Paternal Grandmother     Social History   Socioeconomic History  . Marital status: Single    Spouse name: Not on file  . Number of children: Not on file  . Years of education: Not on file  . Highest education level: Not on file  Occupational History  . Not on file  Tobacco Use  . Smoking status: Never Smoker  . Smokeless tobacco: Never Used  Substance and Sexual Activity  . Alcohol use: No    Comment: wine occasionally  . Drug use: No  .  Sexual activity: Not on file  Other Topics Concern  . Not on file  Social History Narrative  . Not on file   Social Determinants of Health   Financial Resource Strain:   . Difficulty of Paying Living Expenses: Not on file  Food Insecurity:   . Worried About Programme researcher, broadcasting/film/video in the Last Year: Not on file  . Ran Out of Food in the Last Year: Not on file  Transportation Needs:   . Lack of Transportation (Medical): Not on file  . Lack of Transportation (Non-Medical): Not on file  Physical Activity:   . Days of Exercise per Week: Not on file  . Minutes of Exercise per Session: Not on file  Stress:   . Feeling of Stress : Not on file  Social Connections:   . Frequency of Communication with Friends and Family: Not on file  . Frequency of Social Gatherings with Friends and Family: Not on file  . Attends Religious Services: Not on file  . Active Member of Clubs or Organizations: Not on file  . Attends Banker Meetings: Not on file  . Marital Status: Not on file  Intimate Partner Violence:   . Fear of Current or Ex-Partner: Not on file  . Emotionally Abused: Not on file  . Physically Abused: Not on file  . Sexually Abused: Not  on file    Outpatient Medications Prior to Visit  Medication Sig Dispense Refill  . fluticasone (FLONASE) 50 MCG/ACT nasal spray Place 2 sprays into both nostrils daily. 16 g 6  . triamterene-hydrochlorothiazide (MAXZIDE) 75-50 MG tablet Take 1 tablet by mouth daily. 90 tablet 1   No facility-administered medications prior to visit.    No Known Allergies  ROS Review of Systems  Constitutional: Negative for chills and fever.  HENT: Negative for congestion, sinus pressure, sinus pain and sore throat.   Eyes: Negative for pain and discharge.  Respiratory: Negative for cough and shortness of breath.   Cardiovascular: Negative for chest pain and palpitations.  Gastrointestinal: Negative for abdominal pain, constipation, diarrhea, nausea and  vomiting.  Endocrine: Negative for polydipsia and polyuria.  Genitourinary: Negative for dysuria and hematuria.  Musculoskeletal: Negative for neck pain and neck stiffness.  Skin: Negative for rash.  Neurological: Negative for dizziness and weakness.  Psychiatric/Behavioral: Negative for agitation and behavioral problems.      Objective:    Physical Exam Vitals reviewed.  Constitutional:      General: She is not in acute distress.    Appearance: She is obese. She is not diaphoretic.  HENT:     Head: Normocephalic and atraumatic.     Nose: Nose normal. No congestion.     Mouth/Throat:     Mouth: Mucous membranes are moist.     Pharynx: No posterior oropharyngeal erythema.  Eyes:     General: No scleral icterus.    Extraocular Movements: Extraocular movements intact.     Pupils: Pupils are equal, round, and reactive to light.  Cardiovascular:     Rate and Rhythm: Normal rate and regular rhythm.     Pulses: Normal pulses.     Heart sounds: Normal heart sounds. No murmur heard.   Pulmonary:     Breath sounds: Normal breath sounds. No wheezing or rales.  Abdominal:     Palpations: Abdomen is soft.     Tenderness: There is no abdominal tenderness.  Musculoskeletal:     Cervical back: Neck supple. No tenderness.     Right lower leg: No edema.     Left lower leg: No edema.  Skin:    General: Skin is warm.     Findings: No rash.  Neurological:     General: No focal deficit present.     Mental Status: She is alert and oriented to person, place, and time.     Sensory: No sensory deficit.     Motor: No weakness.  Psychiatric:        Mood and Affect: Mood normal.        Behavior: Behavior normal.     BP (!) 154/100 (BP Location: Right Arm, Patient Position: Sitting, Cuff Size: Normal)   Pulse 87   Temp 98.4 F (36.9 C) (Oral)   Resp 18   Ht 5\' 2"  (1.575 m)   Wt 186 lb 1.9 oz (84.4 kg)   SpO2 99%   BMI 34.04 kg/m  Wt Readings from Last 3 Encounters:  07/20/20 186  lb 1.9 oz (84.4 kg)  05/20/20 185 lb 6.4 oz (84.1 kg)  03/20/19 180 lb 1.3 oz (81.7 kg)     Health Maintenance Due  Topic Date Due  . Hepatitis C Screening  Never done  . COLONOSCOPY  Never done  . TETANUS/TDAP  05/20/2019    There are no preventive care reminders to display for this patient.  Lab Results  Component Value  Date   TSH 1.60 07/16/2020   Lab Results  Component Value Date   WBC 4.1 03/14/2019   HGB 13.9 03/14/2019   HCT 40.3 03/14/2019   MCV 88.0 03/14/2019   PLT 183 03/14/2019   Lab Results  Component Value Date   NA 138 07/16/2020   K 4.6 07/16/2020   CO2 26 07/16/2020   GLUCOSE 111 (H) 07/16/2020   BUN 12 07/16/2020   CREATININE 0.62 07/16/2020   BILITOT 1.3 (H) 03/14/2019   ALKPHOS 37 (L) 08/22/2014   AST 20 03/14/2019   ALT 19 03/14/2019   PROT 7.2 03/14/2019   ALBUMIN 4.3 08/22/2014   CALCIUM 10.0 07/16/2020   Lab Results  Component Value Date   CHOL 222 (H) 07/16/2020   Lab Results  Component Value Date   HDL 49 (L) 07/16/2020   Lab Results  Component Value Date   LDLCALC 144 (H) 07/16/2020   Lab Results  Component Value Date   TRIG 157 (H) 07/16/2020   Lab Results  Component Value Date   CHOLHDL 4.5 07/16/2020   Lab Results  Component Value Date   HGBA1C 5.0 03/14/2019      Assessment & Plan:   Problem List Items Addressed This Visit      Cardiovascular and Mediastinum   Essential hypertension - Primary    BP Readings from Last 1 Encounters:  07/20/20 (!) 154/100   uncontrolled with Maxzide Added Amlodipine Counseled for compliance with the medications Advised DASH diet and moderate exercise/walking, at least 150 mins/week       Relevant Medications   amLODipine (NORVASC) 5 MG tablet     Other   Vitamin D deficiency    Last vitamin D Lab Results  Component Value Date   VD25OH 12 (L) 07/16/2020   Started Vit D 50,000 IU every week       Relevant Medications   Vitamin D, Ergocalciferol, (DRISDOL)  1.25 MG (50000 UNIT) CAPS capsule   Hyperlipidemia LDL goal <100    Will try diet modification for now Recheck lipid profile later      Relevant Medications   amLODipine (NORVASC) 5 MG tablet   Obesity (BMI 30.0-34.9)    Diet modification and moderate exercise/walking discussed. Patient expressed understanding.         Meds ordered this encounter  Medications  . Vitamin D, Ergocalciferol, (DRISDOL) 1.25 MG (50000 UNIT) CAPS capsule    Sig: Take 1 capsule (50,000 Units total) by mouth every 7 (seven) days.    Dispense:  12 capsule    Refill:  1  . amLODipine (NORVASC) 5 MG tablet    Sig: Take 1 tablet (5 mg total) by mouth daily.    Dispense:  90 tablet    Refill:  1    Follow-up: Return in about 3 months (around 10/20/2020).    Anabel Halon, MD

## 2020-07-20 NOTE — Patient Instructions (Addendum)
Please start taking vitamin D tablets as prescribed.  Please continue to take medications for hypertension as prescribed.  Please check blood pressure at home and bring the log in the next visit.  Please follow DASH diet and perform moderate exercise/walking at least 150 minutes/week.  DASH stands for Dietary Approaches to Stop Hypertension. The DASH diet is a healthy-eating plan designed to help treat or prevent high blood pressure (hypertension).  The DASH diet includes foods that are rich in potassium, calcium and magnesium. These nutrients help control blood pressure. The diet limits foods that are high in sodium, saturated fat and added sugars.  Studies have shown that the DASH diet can lower blood pressure in as little as two weeks. The diet can also lower low-density lipoprotein (LDL or "bad") cholesterol levels in the blood. High blood pressure and high LDL cholesterol levels are two major risk factors for heart disease and stroke.    DASH diet: Recommended servings The DASH diet provides daily and weekly nutritional goals. The number of servings you should have depends on your daily calorie needs.  Here's a look at the recommended servings from each food group for a 2,000-calorie-a-day DASH diet:  Grains: 6 to 8 servings a day. One serving is one slice bread, 1 ounce dry cereal, or 1/2 cup cooked cereal, rice or pasta. Vegetables: 4 to 5 servings a day. One serving is 1 cup raw leafy green vegetable, 1/2 cup cut-up raw or cooked vegetables, or 1/2 cup vegetable juice. Fruits: 4 to 5 servings a day. One serving is one medium fruit, 1/2 cup fresh, frozen or canned fruit, or 1/2 cup fruit juice. Fat-free or low-fat dairy products: 2 to 3 servings a day. One serving is 1 cup milk or yogurt, or 1 1/2 ounces cheese. Lean meats, poultry and fish: six 1-ounce servings or fewer a day. One serving is 1 ounce cooked meat, poultry or fish, or 1 egg. Nuts, seeds and legumes: 4 to 5 servings a  week. One serving is 1/3 cup nuts, 2 tablespoons peanut butter, 2 tablespoons seeds, or 1/2 cup cooked legumes (dried beans or peas). Fats and oils: 2 to 3 servings a day. One serving is 1 teaspoon soft margarine, 1 teaspoon vegetable oil, 1 tablespoon mayonnaise or 2 tablespoons salad dressing. Sweets and added sugars: 5 servings or fewer a week. One serving is 1 tablespoon sugar, jelly or jam, 1/2 cup sorbet, or 1 cup lemonade.

## 2020-07-20 NOTE — Assessment & Plan Note (Signed)
Diet modification and moderate exercise/walking discussed. Patient expressed understanding.

## 2020-07-20 NOTE — Assessment & Plan Note (Signed)
BP Readings from Last 1 Encounters:  07/20/20 (!) 154/100   uncontrolled with Maxzide Added Amlodipine Counseled for compliance with the medications Advised DASH diet and moderate exercise/walking, at least 150 mins/week

## 2020-07-20 NOTE — Assessment & Plan Note (Signed)
Last vitamin D Lab Results  Component Value Date   VD25OH 12 (L) 07/16/2020   Started Vit D 50,000 IU every week

## 2020-07-20 NOTE — Assessment & Plan Note (Signed)
Will try diet modification for now Recheck lipid profile later

## 2020-07-28 ENCOUNTER — Encounter: Payer: Self-pay | Admitting: Family Medicine

## 2020-07-28 ENCOUNTER — Telehealth: Payer: Self-pay | Admitting: Family Medicine

## 2020-07-28 ENCOUNTER — Other Ambulatory Visit: Payer: Self-pay

## 2020-07-28 DIAGNOSIS — Z1159 Encounter for screening for other viral diseases: Secondary | ICD-10-CM

## 2020-07-28 DIAGNOSIS — E785 Hyperlipidemia, unspecified: Secondary | ICD-10-CM

## 2020-07-28 DIAGNOSIS — I10 Essential (primary) hypertension: Secondary | ICD-10-CM

## 2020-07-28 DIAGNOSIS — E559 Vitamin D deficiency, unspecified: Secondary | ICD-10-CM

## 2020-07-28 NOTE — Telephone Encounter (Signed)
Labs ordered.

## 2020-07-28 NOTE — Telephone Encounter (Signed)
Please order for fasting labs for this patient to be drawn 1 week before her March appointment with me.  Fasting CBC lipid panel Chem-7 and EGFR vitamin D and hepatitis C screen thank you

## 2020-08-04 ENCOUNTER — Other Ambulatory Visit: Payer: Self-pay | Admitting: Family Medicine

## 2020-08-04 ENCOUNTER — Encounter: Payer: Self-pay | Admitting: Family Medicine

## 2020-10-12 ENCOUNTER — Other Ambulatory Visit: Payer: Self-pay | Admitting: Family Medicine

## 2020-10-12 DIAGNOSIS — Z1231 Encounter for screening mammogram for malignant neoplasm of breast: Secondary | ICD-10-CM

## 2020-10-20 ENCOUNTER — Ambulatory Visit: Payer: BC Managed Care – PPO | Admitting: Family Medicine

## 2020-11-09 DIAGNOSIS — Z1231 Encounter for screening mammogram for malignant neoplasm of breast: Secondary | ICD-10-CM

## 2020-11-18 ENCOUNTER — Other Ambulatory Visit: Payer: Self-pay | Admitting: Family Medicine

## 2020-11-18 ENCOUNTER — Telehealth: Payer: Self-pay | Admitting: Family Medicine

## 2020-11-18 DIAGNOSIS — Z1231 Encounter for screening mammogram for malignant neoplasm of breast: Secondary | ICD-10-CM

## 2020-11-18 NOTE — Telephone Encounter (Signed)
pls schedule in office appt with me as able by end April, needs Bp re eval and also fasting labs ordered in November need to be done about 5 days before tha t visit, thanks!

## 2020-12-07 NOTE — Telephone Encounter (Signed)
Pt will need to check her schedule,  pt will call us back

## 2021-01-01 ENCOUNTER — Other Ambulatory Visit: Payer: Self-pay | Admitting: Family Medicine

## 2021-01-01 DIAGNOSIS — Z1231 Encounter for screening mammogram for malignant neoplasm of breast: Secondary | ICD-10-CM

## 2021-01-04 DIAGNOSIS — Z1231 Encounter for screening mammogram for malignant neoplasm of breast: Secondary | ICD-10-CM

## 2021-01-10 ENCOUNTER — Other Ambulatory Visit: Payer: Self-pay | Admitting: Internal Medicine

## 2021-01-10 DIAGNOSIS — E559 Vitamin D deficiency, unspecified: Secondary | ICD-10-CM

## 2021-01-20 ENCOUNTER — Other Ambulatory Visit: Payer: Self-pay | Admitting: Family Medicine

## 2021-02-22 ENCOUNTER — Other Ambulatory Visit: Payer: Self-pay

## 2021-02-22 ENCOUNTER — Ambulatory Visit
Admission: RE | Admit: 2021-02-22 | Discharge: 2021-02-22 | Disposition: A | Discharge status: Home / Self Care | Payer: BC Managed Care – PPO | Source: Ambulatory Visit

## 2021-02-22 DIAGNOSIS — Z1231 Encounter for screening mammogram for malignant neoplasm of breast: Secondary | ICD-10-CM | POA: Diagnosis not present

## 2021-03-01 ENCOUNTER — Other Ambulatory Visit: Payer: Self-pay

## 2021-03-01 DIAGNOSIS — Z1159 Encounter for screening for other viral diseases: Secondary | ICD-10-CM

## 2021-03-01 DIAGNOSIS — I10 Essential (primary) hypertension: Secondary | ICD-10-CM

## 2021-03-01 DIAGNOSIS — E785 Hyperlipidemia, unspecified: Secondary | ICD-10-CM

## 2021-03-01 DIAGNOSIS — E559 Vitamin D deficiency, unspecified: Secondary | ICD-10-CM

## 2021-03-02 ENCOUNTER — Telehealth: Payer: Self-pay

## 2021-03-02 NOTE — Telephone Encounter (Signed)
I called the pt , and she does not have time to deal with Lab work and an appt right now she will call us back.

## 2021-06-10 DIAGNOSIS — D225 Melanocytic nevi of trunk: Secondary | ICD-10-CM | POA: Diagnosis not present

## 2021-06-10 DIAGNOSIS — L818 Other specified disorders of pigmentation: Secondary | ICD-10-CM | POA: Diagnosis not present

## 2021-06-10 DIAGNOSIS — Z1283 Encounter for screening for malignant neoplasm of skin: Secondary | ICD-10-CM | POA: Diagnosis not present

## 2021-06-27 ENCOUNTER — Other Ambulatory Visit: Payer: Self-pay | Admitting: Internal Medicine

## 2021-06-27 DIAGNOSIS — E559 Vitamin D deficiency, unspecified: Secondary | ICD-10-CM

## 2021-07-23 ENCOUNTER — Other Ambulatory Visit: Payer: Self-pay | Admitting: Family Medicine

## 2021-08-12 DIAGNOSIS — L818 Other specified disorders of pigmentation: Secondary | ICD-10-CM | POA: Diagnosis not present

## 2022-01-11 ENCOUNTER — Other Ambulatory Visit: Payer: Self-pay | Admitting: Family Medicine

## 2022-01-11 DIAGNOSIS — Z1231 Encounter for screening mammogram for malignant neoplasm of breast: Secondary | ICD-10-CM

## 2022-02-28 ENCOUNTER — Other Ambulatory Visit: Payer: Self-pay | Admitting: Family Medicine

## 2022-02-28 ENCOUNTER — Ambulatory Visit
Admission: RE | Admit: 2022-02-28 | Discharge: 2022-02-28 | Disposition: A | Discharge status: Home / Self Care | Payer: BC Managed Care – PPO | Source: Ambulatory Visit

## 2022-02-28 DIAGNOSIS — Z1231 Encounter for screening mammogram for malignant neoplasm of breast: Secondary | ICD-10-CM

## 2022-03-01 ENCOUNTER — Telehealth: Payer: Self-pay | Admitting: Family Medicine

## 2022-03-01 ENCOUNTER — Other Ambulatory Visit: Payer: Self-pay

## 2022-03-01 DIAGNOSIS — I10 Essential (primary) hypertension: Secondary | ICD-10-CM

## 2022-03-01 DIAGNOSIS — E785 Hyperlipidemia, unspecified: Secondary | ICD-10-CM

## 2022-03-01 DIAGNOSIS — Z1159 Encounter for screening for other viral diseases: Secondary | ICD-10-CM

## 2022-03-01 DIAGNOSIS — E559 Vitamin D deficiency, unspecified: Secondary | ICD-10-CM

## 2022-03-01 NOTE — Telephone Encounter (Signed)
Labs ordered called patient no voicemail set up, also sent mychart msg as well, will hold to follow up.

## 2022-03-01 NOTE — Telephone Encounter (Signed)
Pt needs labs and OV , has not had labs in 2 years, and today 90 day supply of BP med has been sent in. Pls contact her , explain and sched appt, labs needed, CBC, lipid, cmp and EGFR, TSH, vit D and hep C screen Also has catchup with preventive health, please do enourage her to sched appt

## 2022-03-02 ENCOUNTER — Other Ambulatory Visit: Payer: Self-pay

## 2022-03-02 DIAGNOSIS — E669 Obesity, unspecified: Secondary | ICD-10-CM

## 2022-03-02 DIAGNOSIS — E785 Hyperlipidemia, unspecified: Secondary | ICD-10-CM

## 2022-03-02 DIAGNOSIS — Z1159 Encounter for screening for other viral diseases: Secondary | ICD-10-CM

## 2022-03-02 DIAGNOSIS — E559 Vitamin D deficiency, unspecified: Secondary | ICD-10-CM

## 2022-03-02 DIAGNOSIS — Z1211 Encounter for screening for malignant neoplasm of colon: Secondary | ICD-10-CM

## 2022-03-02 DIAGNOSIS — I10 Essential (primary) hypertension: Secondary | ICD-10-CM

## 2022-03-02 NOTE — Telephone Encounter (Signed)
Patient sent mychart msg back she will call to schedule follow up and labs reordered to [facility] per patient request

## 2022-06-03 ENCOUNTER — Telehealth: Payer: Self-pay | Admitting: Family Medicine

## 2022-06-03 NOTE — Telephone Encounter (Signed)
Pt is scheduled for a cpe 12/1 & wants to know if you could please order her labs in advance so that she can have the results at the appointment?     [facility]

## 2022-06-08 NOTE — Telephone Encounter (Signed)
Patient aware.

## 2022-08-12 ENCOUNTER — Encounter: Payer: BC Managed Care – PPO | Admitting: Family Medicine

## 2022-09-07 ENCOUNTER — Telehealth: Payer: Self-pay | Admitting: Family Medicine

## 2022-09-09 DIAGNOSIS — I10 Essential (primary) hypertension: Secondary | ICD-10-CM | POA: Diagnosis not present

## 2022-09-09 DIAGNOSIS — E785 Hyperlipidemia, unspecified: Secondary | ICD-10-CM | POA: Diagnosis not present

## 2022-09-09 DIAGNOSIS — Z1159 Encounter for screening for other viral diseases: Secondary | ICD-10-CM | POA: Diagnosis not present

## 2022-09-09 DIAGNOSIS — E559 Vitamin D deficiency, unspecified: Secondary | ICD-10-CM | POA: Diagnosis not present

## 2022-09-09 DIAGNOSIS — R7301 Impaired fasting glucose: Secondary | ICD-10-CM | POA: Diagnosis not present

## 2022-09-11 ENCOUNTER — Encounter: Payer: Self-pay | Admitting: Family Medicine

## 2022-09-13 ENCOUNTER — Other Ambulatory Visit: Payer: Self-pay

## 2022-09-13 DIAGNOSIS — R7301 Impaired fasting glucose: Secondary | ICD-10-CM

## 2022-09-14 ENCOUNTER — Other Ambulatory Visit: Payer: Self-pay

## 2022-09-14 ENCOUNTER — Telehealth: Payer: Self-pay | Admitting: Family Medicine

## 2022-09-14 ENCOUNTER — Encounter: Payer: BC Managed Care – PPO | Admitting: Family Medicine

## 2022-09-14 DIAGNOSIS — R7301 Impaired fasting glucose: Secondary | ICD-10-CM

## 2022-09-14 DIAGNOSIS — E559 Vitamin D deficiency, unspecified: Secondary | ICD-10-CM

## 2022-09-14 MED ORDER — VITAMIN D (ERGOCALCIFEROL) 1.25 MG (50000 UNIT) PO CAPS: 50000.0000 [IU] | ORAL_CAPSULE | ORAL | 1 refills | Status: DC

## 2022-09-14 NOTE — Telephone Encounter (Signed)
Pt was waiting for her appt to refill but has had to r/s to March. Wants to know if you can please refill her med?  Prescription Request  09/14/2022  Is this a "Controlled Substance" medicine? No  LOV: Visit date not found  What is the name of the medication or equipment? Vitamin D, Ergocalciferol, (DRISDOL) 1.25 MG (50000 UNIT) CAPS capsule   Have you contacted your pharmacy to request a refill? No   Which pharmacy would you like this sent to?  [id number] - [place], [place] [address] [place] [address] Phone: [phone] Fax: [phone]    Patient notified that their request is being sent to the clinical staff for review and that they should receive a response within 2 business days.   Please advise at [facility] [phone]

## 2022-09-14 NOTE — Telephone Encounter (Signed)
Refills sent

## 2022-09-15 LAB — CBC
HCT: 41.2 % (ref 35.0–45.0)
Hemoglobin: 14.3 g/dL (ref 11.7–15.5)
MCH: 30.7 pg (ref 27.0–33.0)
MCHC: 34.7 g/dL (ref 32.0–36.0)
MCV: 88.4 fL (ref 80.0–100.0)
MPV: 10.4 fL (ref 7.5–12.5)
Platelets: 180 10*3/uL (ref 140–400)
RBC: 4.66 10*6/uL (ref 3.80–5.10)
RDW: 12.7 % (ref 11.0–15.0)
WBC: 4 10*3/uL (ref 3.8–10.8)

## 2022-09-15 LAB — HEMOGLOBIN A1C
Hgb A1c MFr Bld: 5.6 % of total Hgb (ref <5.7)
Mean Plasma Glucose: 114 mg/dL
eAG (mmol/L): 6.3 mmol/L

## 2022-09-15 LAB — TSH: TSH: 1.68 mIU/L

## 2022-09-15 LAB — COMPLETE METABOLIC PANEL WITH GFR
AG Ratio: 1.5 (calc) (ref 1.0–2.5)
ALT: 57 U/L — ABNORMAL HIGH (ref 6–29)
AST: 41 U/L — ABNORMAL HIGH (ref 10–35)
Albumin: 4.5 g/dL (ref 3.6–5.1)
Alkaline phosphatase (APISO): 58 U/L (ref 37–153)
BUN: 19 mg/dL (ref 7–25)
CO2: 25 mmol/L (ref 20–32)
Calcium: 9.6 mg/dL (ref 8.6–10.4)
Chloride: 102 mmol/L (ref 98–110)
Creat: 0.6 mg/dL (ref 0.50–1.03)
Globulin: 3.1 g/dL (calc) (ref 1.9–3.7)
Glucose, Bld: 106 mg/dL — ABNORMAL HIGH (ref 65–99)
Potassium: 4.2 mmol/L (ref 3.5–5.3)
Sodium: 139 mmol/L (ref 135–146)
Total Bilirubin: 1.1 mg/dL (ref 0.2–1.2)
Total Protein: 7.6 g/dL (ref 6.1–8.1)
eGFR: 107 mL/min/{1.73_m2} (ref >=60)

## 2022-09-15 LAB — LIPID PANEL
Cholesterol: 245 mg/dL — ABNORMAL HIGH (ref <200)
HDL: 60 mg/dL (ref >=50)
LDL Cholesterol (Calc): 147 mg/dL (calc) — ABNORMAL HIGH
Non-HDL Cholesterol (Calc): 185 mg/dL (calc) — ABNORMAL HIGH (ref <130)
Total CHOL/HDL Ratio: 4.1 (calc) (ref <5.0)
Triglycerides: 237 mg/dL — ABNORMAL HIGH (ref <150)

## 2022-09-15 LAB — TEST AUTHORIZATION

## 2022-09-15 LAB — VITAMIN D 25 HYDROXY (VIT D DEFICIENCY, FRACTURES): Vit D, 25-Hydroxy: 16 ng/mL — ABNORMAL LOW (ref 30–100)

## 2022-09-15 LAB — HEPATITIS C ANTIBODY: Hepatitis C Ab: NONREACTIVE

## 2022-09-15 NOTE — Addendum Note (Signed)
Addended by: Smitty Knudsen on: 09/15/2022 10:03 AM   Modules accepted: Orders

## 2022-09-22 ENCOUNTER — Encounter: Payer: BC Managed Care – PPO | Admitting: Family Medicine

## 2022-09-23 ENCOUNTER — Encounter: Payer: Self-pay | Admitting: *Deleted

## 2022-10-07 ENCOUNTER — Encounter: Payer: Self-pay | Admitting: Family Medicine

## 2022-10-07 ENCOUNTER — Ambulatory Visit: Payer: BC Managed Care – PPO | Admitting: Family Medicine

## 2022-10-07 VITALS — BP 133/89 | HR 61 | Ht 62.0 in | Wt 193.1 lb

## 2022-10-07 DIAGNOSIS — E669 Obesity, unspecified: Secondary | ICD-10-CM | POA: Diagnosis not present

## 2022-10-07 DIAGNOSIS — E785 Hyperlipidemia, unspecified: Secondary | ICD-10-CM

## 2022-10-07 DIAGNOSIS — E559 Vitamin D deficiency, unspecified: Secondary | ICD-10-CM

## 2022-10-07 DIAGNOSIS — I10 Essential (primary) hypertension: Secondary | ICD-10-CM

## 2022-10-07 DIAGNOSIS — Z23 Encounter for immunization: Secondary | ICD-10-CM | POA: Diagnosis not present

## 2022-10-07 NOTE — Patient Instructions (Addendum)
Keep appointment in March as before , call iof you need me sooner  Blood pressure   Please bring cuff to next  visit  I am recommending zetia as well as food choice    Hepatic panel 3 days before follow up  It is important that you exercise regularly at least 30 minutes 5 times a week. If you develop chest pain, have severe difficulty breathing, or feel very tired, stop exercising immediately and seek medical attention    You will be  referred for colonoscopy in April/May  Thanks for choosing [facility], we consider it a privelige to serve you.                                                                            F/u in March as before, call if you need me sooner  Flu vaccine today  Please bring cuff to next visit, BP elevated above goal  Hepatic panel to be obtained 3 to 5 days before next visit It is important that you exercise regularly at least 30 minutes 5 times a week. If you develop chest pain, have severe difficulty breathing, or feel very tired, stop exercising immediately and seek medical attention   Continue to work on cutting back on alcohol , especially as you are aware this may be a problem

## 2022-10-09 ENCOUNTER — Encounter: Payer: Self-pay | Admitting: Family Medicine

## 2022-10-09 NOTE — Assessment & Plan Note (Addendum)
After obtaining informed consent, the vaccine is  administered , with no adverse effect noted at the time of administration.  

## 2022-10-09 NOTE — Assessment & Plan Note (Signed)
Elevated at visit DASH diet and commitment to daily physical activity for a minimum of 30 minutes discussed and encouraged, as a part of hypertension management. The importance of attaining a healthy weight is also discussed.     10/07/2022    4:27 PM 10/07/2022    4:25 PM 07/20/2020    8:43 AM 05/20/2020    2:45 PM 06/07/2019    8:30 AM 03/20/2019   10:58 AM 06/19/2018    9:47 AM  BP/Weight  Systolic BP 800 349 179 150 569 794 801  Diastolic BP 89 89 655 88 88 82 84  Wt. (Lbs)  193.08 186.12 185.4  180.08 179  BMI  35.31 kg/m2 34.04 kg/m2 33.91 kg/m2  31.9 kg/m2 32.74 kg/m2

## 2022-10-09 NOTE — Assessment & Plan Note (Signed)
Needs weekly supplement and recently resumed this

## 2022-10-09 NOTE — Progress Notes (Signed)
Carrie Mcintyre     MRN: [id number]      DOB: June 10, 1971   HPI Carrie Mcintyre is here for follow up and re-evaluation of chronic medical conditions, medication management and review of any available recent lab and radiology data.  Preventive health is updated, specifically  Cancer screening and Immunization.   Has been seeing Dermatology regularly C/o worse stress and anxiety with Covid, short staffed and .has had health challenges in her aging father, most recently hospitalized with RSV States she has relied on alcohol to manage the anxiety, recent liver enzymes re elevated and she request a repeat as she is working on alcohol use ROS Denies recent fever or chills. Denies sinus pressure, nasal congestion, ear pain or sore throat. Denies chest congestion, productive cough or wheezing. Denies chest pains, palpitations and leg swelling Denies abdominal pain, nausea, vomiting,diarrhea or constipation.   Denies dysuria, frequency, hesitancy or incontinence. Denies joint pain, swelling and limitation in mobility. Denies headaches, seizures, numbness, or tingling. Denies skin break down or rash.   PE  BP 133/89 (BP Location: Left Arm, Patient Position: Sitting)   Pulse 61   Ht 5\' 2"  (1.575 m)   Wt 193 lb 1.3 oz (87.6 kg)   SpO2 94%   BMI 35.31 kg/m   Patient alert and oriented and in no cardiopulmonary distress.  HEENT: No facial asymmetry, EOMI,     Neck supple .  Chest: Clear to auscultation bilaterally.  CVS: S1, S2 no murmurs, no S3.Regular rate.  ABD: Soft non tender.   Ext: No edema  MS: Adequate ROM spine, shoulders, hips and knees.  Skin: Intact, no ulcerations or rash noted.  Psych: Good eye contact, normal affect. Memory intact not anxious or depressed appearing.  CNS: CN 2-12 intact, power,  normal throughout.no focal deficits noted.   Assessment & Plan  Essential hypertension Elevated at visit DASH diet and commitment to daily physical activity for a  minimum of 30 minutes discussed and encouraged, as a part of hypertension management. The importance of attaining a healthy weight is also discussed.     10/07/2022    4:27 PM 10/07/2022    4:25 PM 07/20/2020    8:43 AM 05/20/2020    2:45 PM 06/07/2019    8:30 AM 03/20/2019   10:58 AM 06/19/2018    9:47 AM  BP/Weight  Systolic BP 294 765 465 035 465 681 275  Diastolic BP 89 89 170 88 88 82 84  Wt. (Lbs)  193.08 186.12 185.4  180.08 179  BMI  35.31 kg/m2 34.04 kg/m2 33.91 kg/m2  31.9 kg/m2 32.74 kg/m2       Obesity (BMI 30.0-34.9)  Patient re-educated about  the importance of commitment to a  minimum of 150 minutes of exercise per week as able.  The importance of healthy food choices with portion control discussed, as well as eating regularly and within a 12 hour window most days. The need to choose "clean , green" food 50 to 75% of the time is discussed, as well as to make water the primary drink and set a goal of 64 ounces water daily.       10/07/2022    4:25 PM 07/20/2020    8:43 AM 05/20/2020    2:45 PM  Weight /BMI  Weight 193 lb 1.3 oz 186 lb 1.9 oz 185 lb 6.4 oz  Height 5\' 2"  (1.575 m) 5\' 2"  (1.575 m) 5\' 2"  (1.575 m)  BMI 35.31 kg/m2 34.04 kg/m2 33.91 kg/m2  deteriorated  Hyperlipidemia LDL goal <100 Hyperlipidemia:Low fat diet discussed and encouraged.   Lipid Panel  Lab Results  Component Value Date   CHOL 245 (H) 09/09/2022   HDL 60 09/09/2022   LDLCALC 147 (H) 09/09/2022   TRIG 237 (H) 09/09/2022   CHOLHDL 4.1 09/09/2022     Recommend zetia and lower fat intake  Need for immunization against influenza After obtaining informed consent, the vaccine is  administered , with no adverse effect noted at the time of administration.

## 2022-10-09 NOTE — Assessment & Plan Note (Signed)
Hyperlipidemia:Low fat diet discussed and encouraged.   Lipid Panel  Lab Results  Component Value Date   CHOL 245 (H) 09/09/2022   HDL 60 09/09/2022   LDLCALC 147 (H) 09/09/2022   TRIG 237 (H) 09/09/2022   CHOLHDL 4.1 09/09/2022     Recommend zetia and lower fat intake

## 2022-10-09 NOTE — Assessment & Plan Note (Signed)
  Patient re-educated about  the importance of commitment to a  minimum of 150 minutes of exercise per week as able.  The importance of healthy food choices with portion control discussed, as well as eating regularly and within a 12 hour window most days. The need to choose "clean , green" food 50 to 75% of the time is discussed, as well as to make water the primary drink and set a goal of 64 ounces water daily.       10/07/2022    4:25 PM 07/20/2020    8:43 AM 05/20/2020    2:45 PM  Weight /BMI  Weight 193 lb 1.3 oz 186 lb 1.9 oz 185 lb 6.4 oz  Height 5\' 2"  (1.575 m) 5\' 2"  (1.575 m) 5\' 2"  (1.575 m)  BMI 35.31 kg/m2 34.04 kg/m2 33.91 kg/m2    deteriorated

## 2022-10-10 ENCOUNTER — Other Ambulatory Visit: Payer: Self-pay | Admitting: Internal Medicine

## 2022-10-19 ENCOUNTER — Telehealth: Payer: Self-pay | Admitting: Family Medicine

## 2022-10-19 ENCOUNTER — Other Ambulatory Visit: Payer: Self-pay

## 2022-10-19 DIAGNOSIS — I10 Essential (primary) hypertension: Secondary | ICD-10-CM

## 2022-10-19 NOTE — Telephone Encounter (Signed)
Patient aware labs ordered  

## 2022-10-19 NOTE — Telephone Encounter (Signed)
Pt states she is supposed to have labs before her CPE in March & she is wanting to know if she can please get these ordered to [facility]?

## 2022-10-29 IMAGING — MG MM DIGITAL SCREENING BILAT W/ TOMO AND CAD
6 of 10 series · 6 of 30 positions shown · non-contrast
Comparison: Previous exam(s).

CLINICAL DATA: Screening.

EXAM:
DIGITAL SCREENING BILATERAL MAMMOGRAM WITH TOMOSYNTHESIS AND CAD
TECHNIQUE: Bilateral screening digital craniocaudal and mediolateral oblique
mammograms were obtained. Bilateral screening digital breast
tomosynthesis was performed. The images were evaluated with
computer-aided detection.

[R MLO synth-2D]
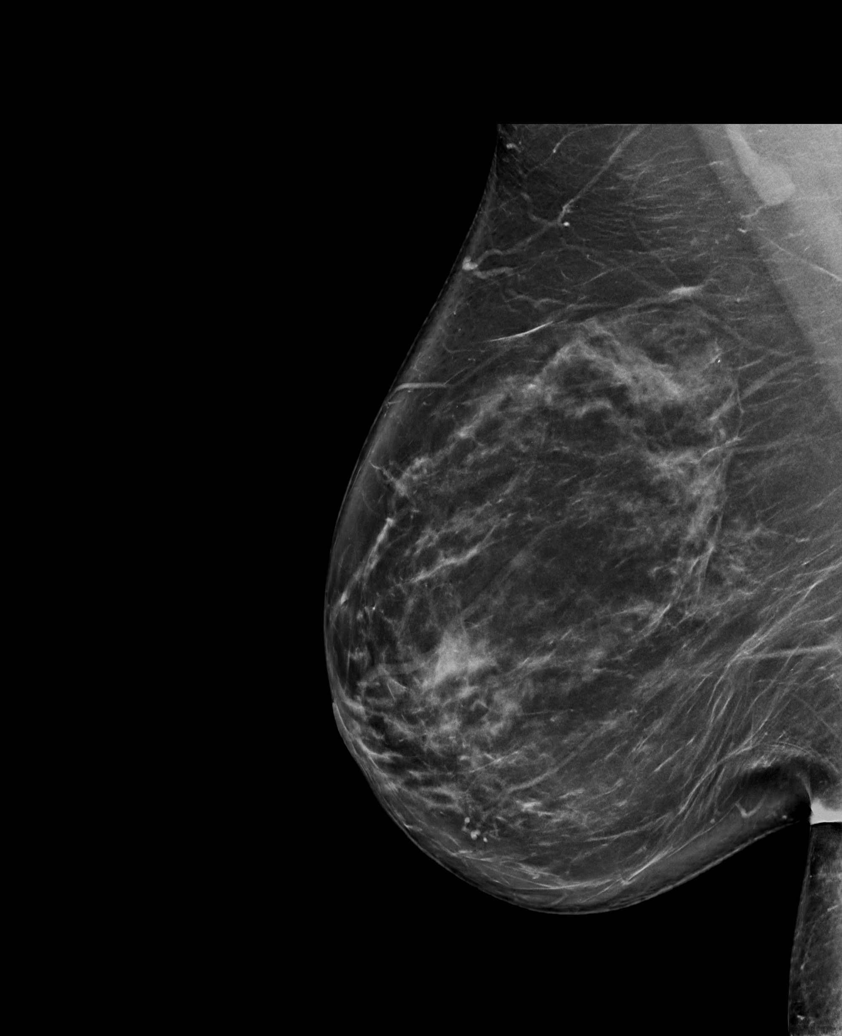

[L CC synth-2D]
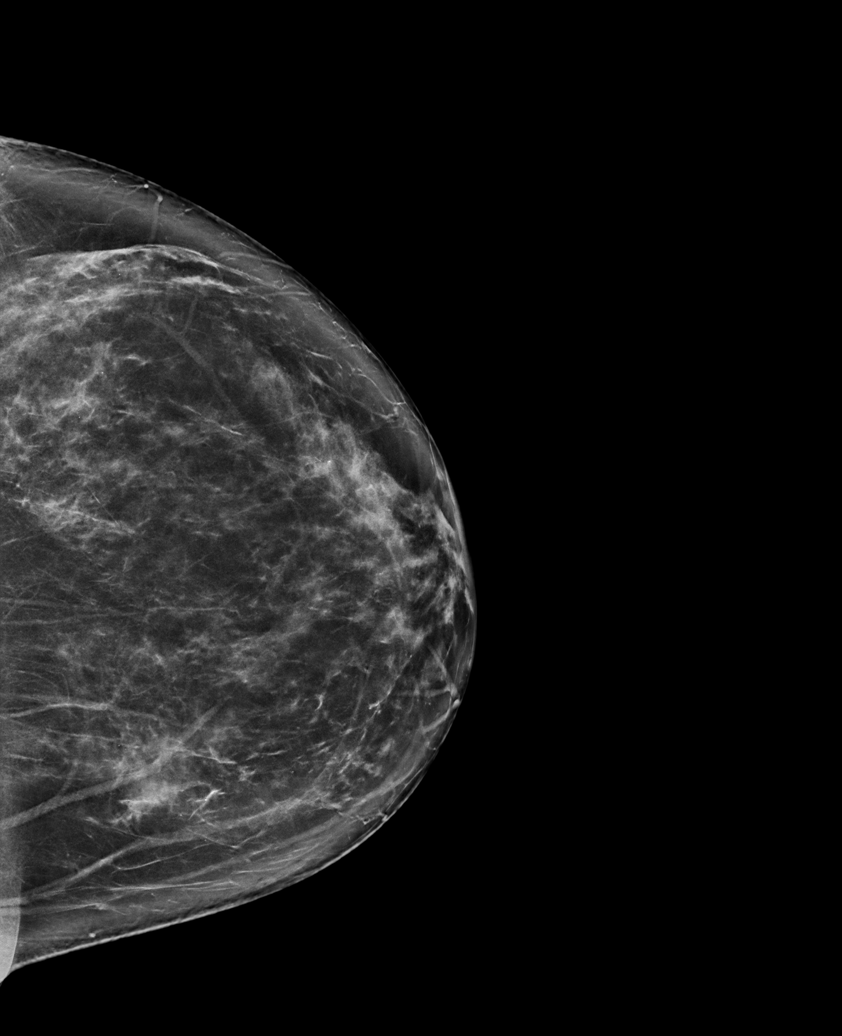

[L MLO synth-2D (1 of 2)]
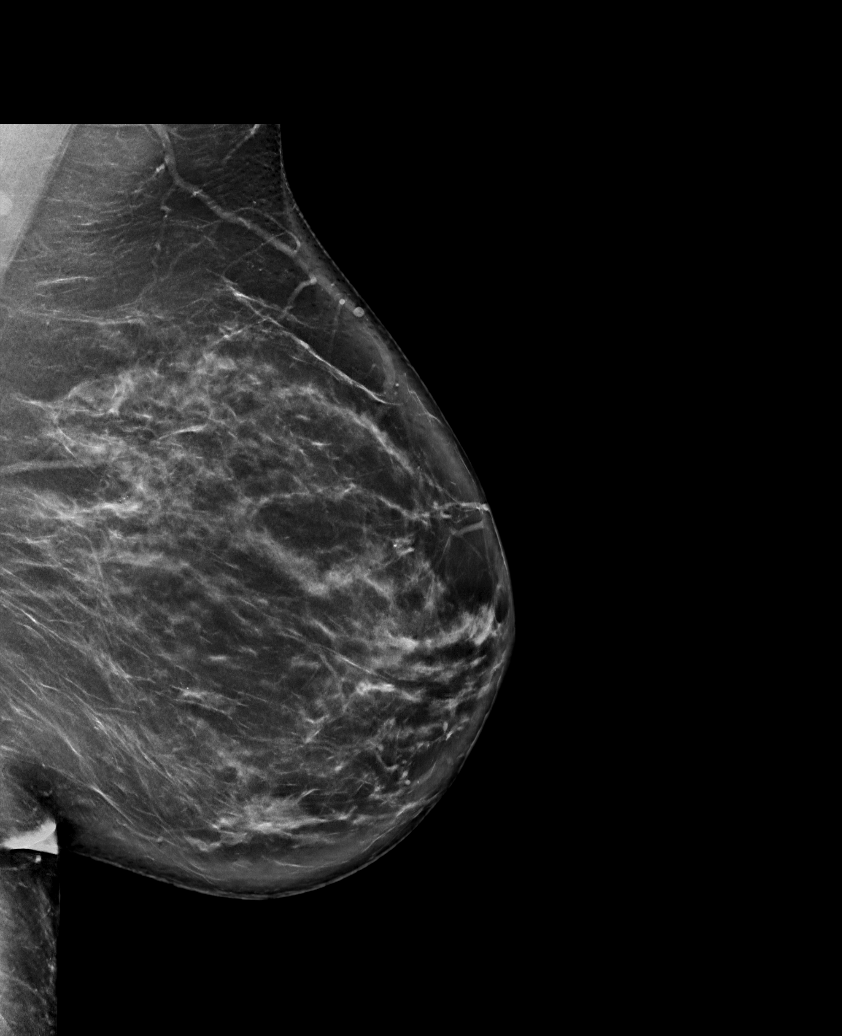

[L MLO synth-2D (2 of 2)]
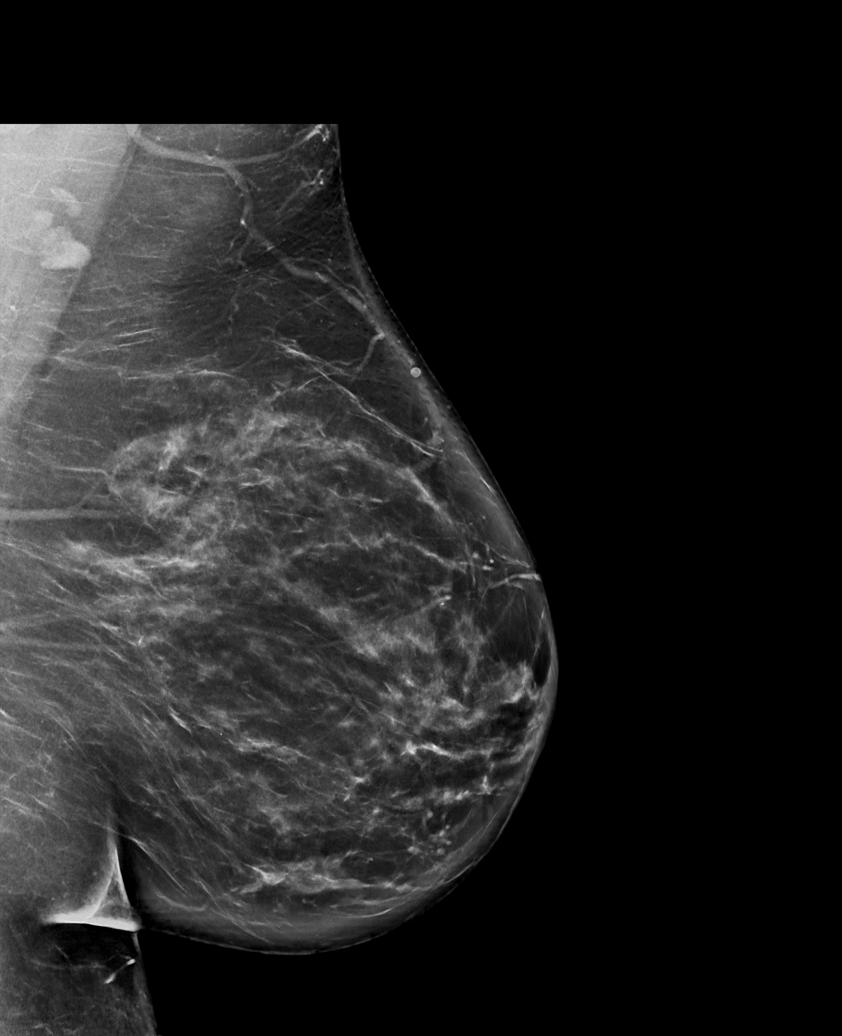

[R CC synth-2D]
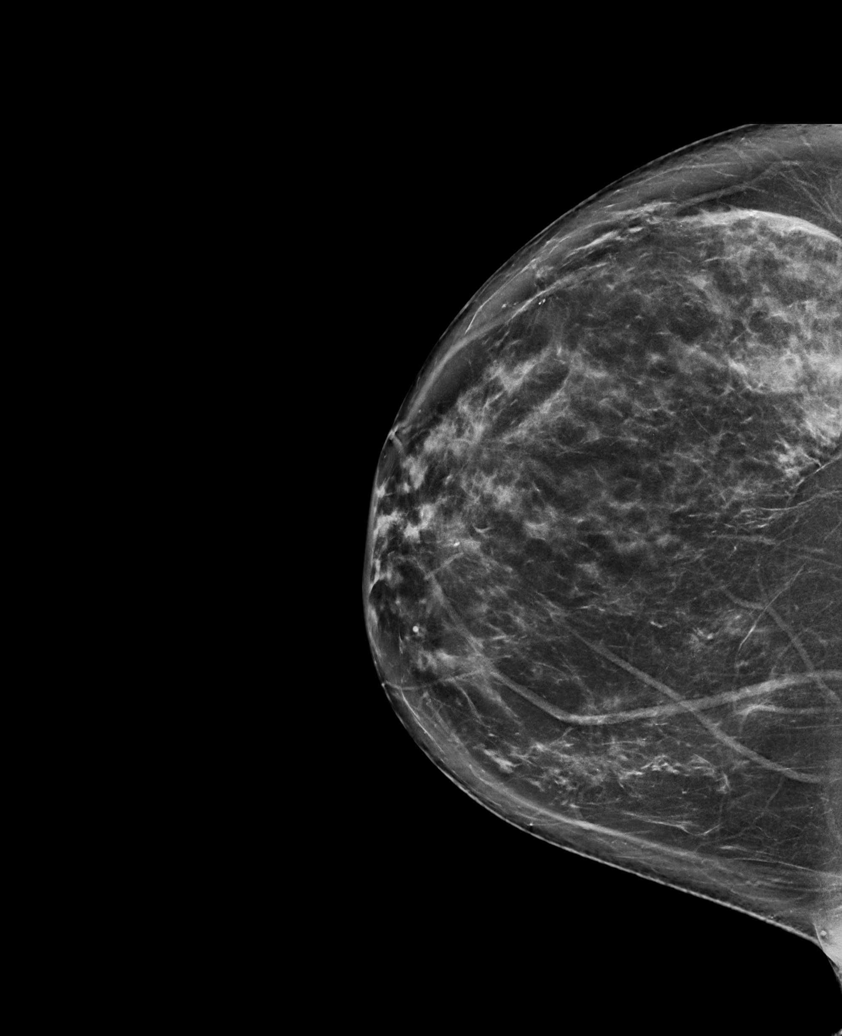

[L MLO tomo · tomo slice 48/95.0]
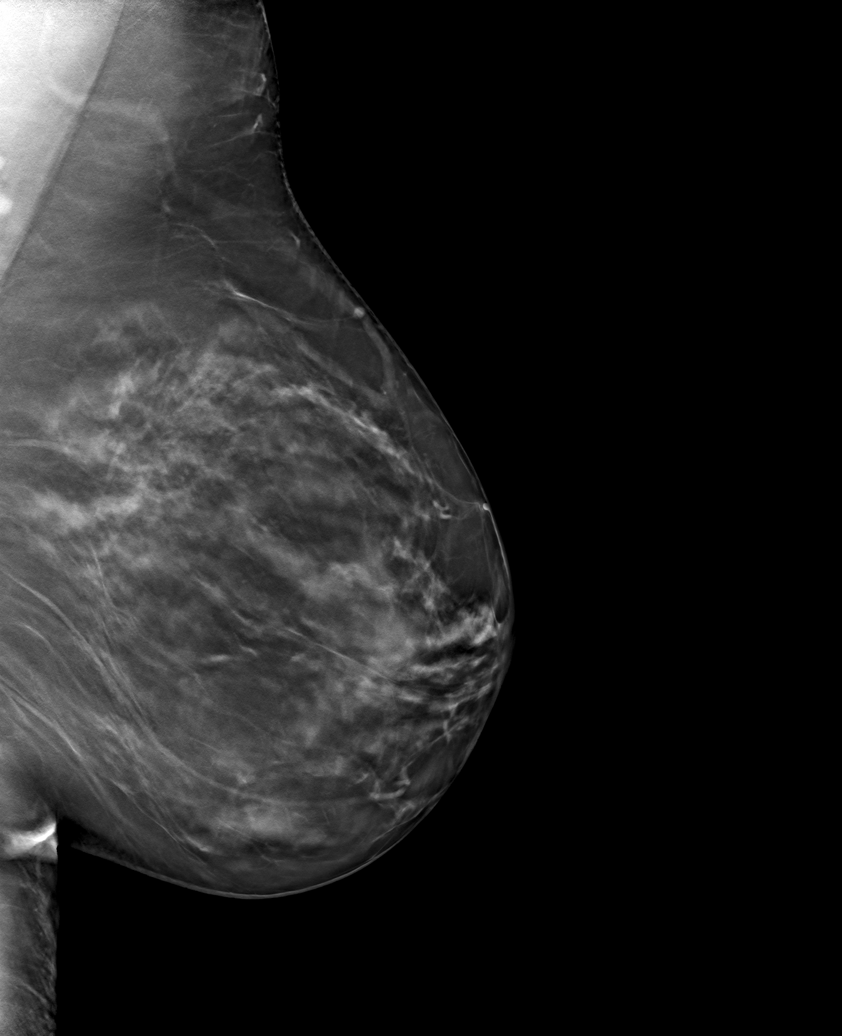

[6 of 30 positions shown; findings below may reference images not displayed]

ACR Breast Density Category c: The breast tissue is heterogeneously
dense, which may obscure small masses.
FINDINGS: There are no findings suspicious for malignancy. The images were
evaluated with computer-aided detection.
IMPRESSION: No mammographic evidence of malignancy. A result letter of this
screening mammogram will be mailed directly to the patient.

RECOMMENDATION:
Screening mammogram in one year. (Code:T4-5-GWO)

BI-RADS CATEGORY  1: Negative.

## 2022-11-16 ENCOUNTER — Encounter: Payer: BC Managed Care – PPO | Admitting: Family Medicine

## 2022-11-18 DIAGNOSIS — I10 Essential (primary) hypertension: Secondary | ICD-10-CM | POA: Diagnosis not present

## 2022-11-19 LAB — HEPATIC FUNCTION PANEL
AG Ratio: 1.5 (calc) (ref 1.0–2.5)
ALT: 33 U/L — ABNORMAL HIGH (ref 6–29)
AST: 26 U/L (ref 10–35)
Albumin: 4.5 g/dL (ref 3.6–5.1)
Alkaline phosphatase (APISO): 46 U/L (ref 37–153)
Bilirubin, Direct: 0.2 mg/dL (ref 0.0–0.2)
Globulin: 3.1 g/dL (calc) (ref 1.9–3.7)
Indirect Bilirubin: 1.3 mg/dL (calc) — ABNORMAL HIGH (ref 0.2–1.2)
Total Bilirubin: 1.5 mg/dL — ABNORMAL HIGH (ref 0.2–1.2)
Total Protein: 7.6 g/dL (ref 6.1–8.1)

## 2022-11-24 ENCOUNTER — Other Ambulatory Visit (HOSPITAL_COMMUNITY)
Admission: RE | Admit: 2022-11-24 | Discharge: 2022-11-24 | Disposition: A | Discharge status: Home / Self Care | Payer: BC Managed Care – PPO | Source: Ambulatory Visit | Attending: Family Medicine | Admitting: Family Medicine

## 2022-11-24 ENCOUNTER — Encounter: Payer: Self-pay | Admitting: Family Medicine

## 2022-11-24 ENCOUNTER — Ambulatory Visit (INDEPENDENT_AMBULATORY_CARE_PROVIDER_SITE_OTHER): Payer: BC Managed Care – PPO | Admitting: Family Medicine

## 2022-11-24 VITALS — BP 148/92 | HR 109 | Ht 62.0 in | Wt 190.0 lb

## 2022-11-24 DIAGNOSIS — Z0001 Encounter for general adult medical examination with abnormal findings: Secondary | ICD-10-CM | POA: Diagnosis not present

## 2022-11-24 DIAGNOSIS — Z124 Encounter for screening for malignant neoplasm of cervix: Secondary | ICD-10-CM | POA: Diagnosis not present

## 2022-11-24 DIAGNOSIS — N950 Postmenopausal bleeding: Secondary | ICD-10-CM | POA: Insufficient documentation

## 2022-11-24 DIAGNOSIS — Z0000 Encounter for general adult medical examination without abnormal findings: Secondary | ICD-10-CM

## 2022-11-24 DIAGNOSIS — Z1211 Encounter for screening for malignant neoplasm of colon: Secondary | ICD-10-CM

## 2022-11-24 DIAGNOSIS — E785 Hyperlipidemia, unspecified: Secondary | ICD-10-CM

## 2022-11-24 DIAGNOSIS — E669 Obesity, unspecified: Secondary | ICD-10-CM

## 2022-11-24 DIAGNOSIS — I10 Essential (primary) hypertension: Secondary | ICD-10-CM

## 2022-11-24 DIAGNOSIS — Z1231 Encounter for screening mammogram for malignant neoplasm of breast: Secondary | ICD-10-CM

## 2022-11-24 MED ORDER — ROSUVASTATIN CALCIUM 10 MG PO TABS: 10.0000 mg | ORAL_TABLET | Freq: Every day | ORAL | 1 refills | Status: DC

## 2022-11-24 NOTE — Assessment & Plan Note (Signed)
Reports regular cycle in 08/2022 after a consecutive 12 month period of no menses, refer Gyne for further eval

## 2022-11-24 NOTE — Patient Instructions (Addendum)
Follow-up in 3-1/2 months, call if you need me sooner.  Continue the same blood pressure medication you are taking since you report normal pressures at home.  Please check your blood pressure once weekly and if you consistently get blood pressures 140/85 or more on more than 1 occasion please call /message , since  I do believe you need additional blood pressure medication based on the readings I have here and I will add amlodipine to your current treatment.  Please do bring your blood pressure cuff to the next visit.  Please schedule mamm[facility] at [facility] for your cholesterol is rosuvastatin 10 mg daily.  Please also work on lowering fat intake.  You are being referred to gynecology for evaluation of postmenopausal bleeding.  You are being referred to gastroenterology for screening colonoscopy.  Please get fasting lipid CMP and EGFR 3 to 5 days before your follow-up in the office.Nurse pls cancel HBA1C in system  It is important that you exercise regularly at least 30 minutes 5 times a week. If you develop chest pain, have severe difficulty breathing, or feel very tired, stop exercising immediately and seek medical attention   Enjoy and cheering you on your health jouney to better health!  Thanks for choosing [facility], we consider it a privelige to serve you.

## 2022-11-25 ENCOUNTER — Encounter: Payer: Self-pay | Admitting: Family Medicine

## 2022-11-28 NOTE — Assessment & Plan Note (Signed)
Hyperlipidemia:Low fat diet discussed and encouraged.   Lipid Panel  Lab Results  Component Value Date   CHOL 245 (H) 09/09/2022   HDL 60 09/09/2022   LDLCALC 147 (H) 09/09/2022   TRIG 237 (H) 09/09/2022   CHOLHDL 4.1 09/09/2022     Start crestor daily

## 2022-11-28 NOTE — Assessment & Plan Note (Signed)

## 2022-11-28 NOTE — Assessment & Plan Note (Signed)
  Patient re-educated about  the importance of commitment to a  minimum of 150 minutes of exercise per week as able.  The importance of healthy food choices with portion control discussed, as well as eating regularly and within a 12 hour window most days. The need to choose "clean , green" food 50 to 75% of the time is discussed, as well as to make water the primary drink and set a goal of 64 ounces water daily.       11/24/2022    1:06 PM 10/07/2022    4:25 PM 07/20/2020    8:43 AM  Weight /BMI  Weight 190 lb 0.6 oz 193 lb 1.3 oz 186 lb 1.9 oz  Height 5\' 2"  (1.575 m) 5\' 2"  (1.575 m) 5\' 2"  (1.575 m)  BMI 34.76 kg/m2 35.31 kg/m2 34.04 kg/m2

## 2022-11-28 NOTE — Progress Notes (Signed)
Carrie Mcintyre     MRN: [id number]      DOB: [birth year]  HPI: Patient is in for annual physical exam. C/o vaginal bleeding 1 month after achieving 12 months of no bleeding had a regular cycle in  08/2022 Uncontrolled bP is re evaluated, states normal numbrs at home with a reliable cuff  Recent labs,  are reviewed. Immunization is reviewed , and  updated if needed.   PE: BP (!) 148/92   Pulse (!) 109   Ht 5\' 2"  (1.575 m)   Wt 190 lb 0.6 oz (86.2 kg)   SpO2 96%   BMI 34.76 kg/m  Pleasant  female, alert and oriented x 3, in no cardio-pulmonary distress. Afebrile. HEENT No facial trauma or asymetry. Sinuses non tender.  Extra occullar muscles intact.. External ears normal, . Neck: supple, no adenopathy,JVD or thyromegaly.No bruits.  Chest: Clear to ascultation bilaterally.No crackles or wheezes. Non tender to palpation  Breast: No asymetry,no masses or lumps. No tenderness. No nipple discharge or inversion. No axillary or supraclavicular adenopathy  Cardiovascular system; Heart sounds normal,  S1 and  S2 ,no S3.  No murmur, or thrill. Apical beat not displaced Peripheral pulses normal.  Abdomen: Soft, non tender, no organomegaly or masses. No bruits. Bowel sounds normal. No guarding, tenderness or rebound.   GU: External genitalia normal female genitalia , normal female distribution of hair. No lesions. Urethral meatus normal in size, no  Prolapse, no lesions visibly  Present. Bladder non tender. Vagina pink and moist , with no visible lesions , discharge present . Adequate pelvic support no  cystocele or rectocele noted Cervix pink and appears healthy, no lesions or ulcerations noted, no discharge noted from os Uterus normal size, no adnexal masses, no cervical motion or adnexal tenderness.   Musculoskeletal exam: Full ROM of spine, hips , shoulders and knees. No deformity ,swelling or crepitus noted. No muscle wasting or atrophy.    Neurologic: Cranial nerves 2 to 12 intact. Power, tone ,sensation and reflexes normal throughout. No disturbance in gait. No tremor.  Skin: Intact, no ulceration, erythema , scaling or rash noted. Pigmentation normal throughout  Psych; Normal mood and affect. Judgement and concentration normal   Assessment & Plan:  Post-menopausal bleeding Reports regular cycle in 08/2022 after a consecutive 12 month period of no menses, refer Gyne for further eval  Annual physical exam Annual exam as documented. Counseling done  re healthy lifestyle involving commitment to 150 minutes exercise per week, heart healthy diet, and attaining healthy weight.The importance of adequate sleep also discussed. Regular seat belt use and home safety, is also discussed. Changes in health habits are decided on by the patient with goals and time frames  set for achieving them. Immunization and cancer screening needs are specifically addressed at this visit.   Hyperlipidemia LDL goal <100 Hyperlipidemia:Low fat diet discussed and encouraged.   Lipid Panel  Lab Results  Component Value Date   CHOL 245 (H) 09/09/2022   HDL 60 09/09/2022   LDLCALC 147 (H) 09/09/2022   TRIG 237 (H) 09/09/2022   CHOLHDL 4.1 09/09/2022     Start crestor daily  Obesity (BMI 30.0-34.9)  Patient re-educated about  the importance of commitment to a  minimum of 150 minutes of exercise per week as able.  The importance of healthy food choices with portion control discussed, as well as eating regularly and within a 12 hour window most days. The need to choose "clean , green" food 50 to 75%  of the time is discussed, as well as to make water the primary drink and set a goal of 64 ounces water daily.       11/24/2022    1:06 PM 10/07/2022    4:25 PM 07/20/2020    8:43 AM  Weight /BMI  Weight 190 lb 0.6 oz 193 lb 1.3 oz 186 lb 1.9 oz  Height 5\' 2"  (1.575 m) 5\' 2"  (1.575 m) 5\' 2"  (1.575 m)  BMI 34.76 kg/m2 35.31 kg/m2 34.04  kg/m2

## 2022-11-29 LAB — CYTOLOGY - PAP
Comment: NEGATIVE
Diagnosis: NEGATIVE
High risk HPV: NEGATIVE

## 2022-11-30 ENCOUNTER — Encounter: Payer: Self-pay | Admitting: Family Medicine

## 2022-11-30 MED ORDER — EZETIMIBE 10 MG PO TABS: 10.0000 mg | ORAL_TABLET | Freq: Every day | ORAL | 5 refills | Status: DC

## 2022-12-13 ENCOUNTER — Ambulatory Visit: Payer: BC Managed Care – PPO | Admitting: Adult Health

## 2022-12-13 ENCOUNTER — Encounter: Payer: Self-pay | Admitting: Adult Health

## 2022-12-13 VITALS — BP 154/99 | HR 102 | Ht 62.0 in | Wt 191.5 lb

## 2022-12-13 DIAGNOSIS — N950 Postmenopausal bleeding: Secondary | ICD-10-CM

## 2022-12-13 NOTE — Progress Notes (Signed)
Subjective:     Patient ID: Carrie Mcintyre, female   DOB: 1970-12-04, 52 y.o.   MRN: [id number]  HPI Zameria is a 52 year old white female, single, G0P0, in for PMB, she had been 13 months without a period and then had bleeding like a period in December 2023. She had physical and pap with Dr Moshe Cipro 11/24/22.  Last pap was negative HPV,NILM 11/24/22.  PCP is Dr Moshe Cipro  Review of Systems +PMB She had hot flashes about 2 years ago, not now She denies any problems with urination, bowel movements or intercourse  Reviewed past medical,surgical, social and family history. Reviewed medications and allergies.     Objective:   Physical Exam BP (!) 154/99 (BP Location: Left Arm, Patient Position: Sitting, Cuff Size: Normal)   Pulse (!) 102   Ht 5\' 2"  (1.575 m)   Wt 191 lb 8 oz (86.9 kg)   LMP 05/27/2018 (Approximate)   BMI 35.03 kg/m     Skin warm and dry.Pelvic: external genitalia is normal in appearance no lesions, vagina: pale pink,urethra has no lesions or masses noted, cervix:smooth, uterus: normal size, shape and contour, non tender, no masses felt, adnexa: no masses or tenderness noted. Bladder is non tender and no masses felt.  AA is 4 Fall risk is low    12/13/2022    2:24 PM 11/24/2022    1:07 PM 10/07/2022    4:26 PM  Depression screen PHQ 2/9  Decreased Interest 0 0 0  Down, Depressed, Hopeless 0 0 0  PHQ - 2 Score 0 0 0  Altered sleeping 1 1 1   Tired, decreased energy 0 0 1  Change in appetite 0 0 0  Feeling bad or failure about yourself  0 0 0  Trouble concentrating 0 0 0  Moving slowly or fidgety/restless 0 0 0  Suicidal thoughts 0 0 0  PHQ-9 Score 1 1 2   Difficult doing work/chores  Not difficult at all Not difficult at all       12/13/2022    2:24 PM 11/24/2022    1:07 PM 10/07/2022    4:27 PM  GAD 7 : Generalized Anxiety Score  Nervous, Anxious, on Edge 0 0 1  Control/stop worrying 0 0 0  Worry too much - different things 0 0 1  Trouble relaxing 0 0 1   Restless 0 0 0  Easily annoyed or irritable 0 0 0  Afraid - awful might happen 0 0 0  Total GAD 7 Score 0 0 3  Anxiety Difficulty  Not difficult at all Not difficult at all      Upstream - 12/13/22 1422       Pregnancy Intention Screening   Does the patient want to become pregnant in the next year? N/A    Does the patient's partner want to become pregnant in the next year? N/A    Would the patient like to discuss contraceptive options today? N/A      Contraception Wrap Up   Current Method No Method - Other Reason   postmenopausal   Reason for No Current Contraceptive Method at Intake ([facility] Only) Other    End Method No Method - Other Reason   postmenopausal            Examination chaperoned by Levy Pupa LPN  Assessment:     1. Post-menopausal bleeding Scheduled pelvic US for 12/23/22 at 7:30 am at [facility] Will talk when results back She is aware if endometrial thickness  will need endometrial biopsy to rule out endometrial cancer - US PELVIC COMPLETE WITH TRANSVAGINAL; Future    Review handout on PMB   Plan:     Follow up TBD

## 2022-12-23 ENCOUNTER — Encounter: Payer: Self-pay | Admitting: Family Medicine

## 2022-12-23 ENCOUNTER — Ambulatory Visit (HOSPITAL_COMMUNITY)
Admission: RE | Admit: 2022-12-23 | Discharge: 2022-12-23 | Disposition: A | Discharge status: Home / Self Care | Payer: BC Managed Care – PPO | Source: Ambulatory Visit | Attending: Adult Health | Admitting: Adult Health

## 2022-12-23 DIAGNOSIS — N950 Postmenopausal bleeding: Secondary | ICD-10-CM | POA: Diagnosis not present

## 2022-12-26 ENCOUNTER — Telehealth: Payer: Self-pay | Admitting: Adult Health

## 2022-12-26 DIAGNOSIS — N950 Postmenopausal bleeding: Secondary | ICD-10-CM

## 2022-12-26 NOTE — Telephone Encounter (Signed)
Pt aware of [place].Endometrium 0.4 cm will get endometrial biopsy with Dr Charlotta Newton 01/09/23

## 2023-01-01 ENCOUNTER — Encounter: Payer: Self-pay | Admitting: Family Medicine

## 2023-01-02 ENCOUNTER — Other Ambulatory Visit: Payer: Self-pay

## 2023-01-02 MED ORDER — FLUTICASONE PROPIONATE 50 MCG/ACT NA SUSP: 2.0000 | Freq: Every day | NASAL | 5 refills | Status: DC

## 2023-01-09 ENCOUNTER — Other Ambulatory Visit: Payer: BC Managed Care – PPO | Admitting: Obstetrics & Gynecology

## 2023-01-10 ENCOUNTER — Other Ambulatory Visit: Payer: Self-pay | Admitting: Family Medicine

## 2023-01-27 ENCOUNTER — Encounter: Payer: Self-pay | Admitting: Obstetrics & Gynecology

## 2023-01-27 ENCOUNTER — Ambulatory Visit: Payer: BC Managed Care – PPO | Admitting: Obstetrics & Gynecology

## 2023-01-27 ENCOUNTER — Other Ambulatory Visit (HOSPITAL_COMMUNITY)
Admission: RE | Admit: 2023-01-27 | Discharge: 2023-01-27 | Disposition: A | Discharge status: Home / Self Care | Payer: BC Managed Care – PPO | Source: Ambulatory Visit | Attending: Obstetrics & Gynecology | Admitting: Obstetrics & Gynecology

## 2023-01-27 VITALS — BP 137/87 | HR 94 | Ht 62.0 in | Wt 190.8 lb

## 2023-01-27 DIAGNOSIS — N950 Postmenopausal bleeding: Secondary | ICD-10-CM | POA: Diagnosis not present

## 2023-01-27 DIAGNOSIS — R9389 Abnormal findings on diagnostic imaging of other specified body structures: Secondary | ICD-10-CM | POA: Diagnosis not present

## 2023-01-27 NOTE — Progress Notes (Signed)
GYN VISIT Patient name: Carrie Mcintyre MRN [id number]  Date of birth: 14-Apr-1971 Chief Complaint:   Endometrial biopsy  History of Present Illness:   Carrie Mcintyre is a 52 y.o. G0P0000 PM female being seen today for the following:  PMB:   Back in December, had a "regular cycle" where 2-3 day of light bleeding.  Changing pad every 3-4 hours with BRB.  Denies pelvic or abdominal pain.  She reports no further bleeding since that time.  She reports no other acute GYN concerns.    Workup included pelvic ultrasound: 12/2022: 7 x 4 x 3 cm with 0.4 mm endometrium  Patient's last menstrual period was 05/27/2018 (approximate).     12/13/2022    2:24 PM 11/24/2022    1:07 PM 10/07/2022    4:26 PM 07/20/2020    8:44 AM 05/20/2020    2:47 PM  Depression screen PHQ 2/9  Decreased Interest 0 0 0 0 0  Down, Depressed, Hopeless 0 0 0 0 0  PHQ - 2 Score 0 0 0 0 0  Altered sleeping 1 1 1  1   Tired, decreased energy 0 0 1  1  Change in appetite 0 0 0  0  Feeling bad or failure about yourself  0 0 0  0  Trouble concentrating 0 0 0  0  Moving slowly or fidgety/restless 0 0 0  0  Suicidal thoughts 0 0 0  0  PHQ-9 Score 1 1 2  2   Difficult doing work/chores  Not difficult at all Not difficult at all  Not difficult at all     Review of Systems:   Pertinent items are noted in HPI Denies fever/chills, dizziness, headaches, visual disturbances, fatigue, shortness of breath, chest pain, abdominal pain, vomiting, no problems with bowel movements, urination, or intercourse unless otherwise stated above.  Pertinent History Reviewed:  Reviewed past medical,surgical, social, obstetrical and family history.  Reviewed problem list, medications and allergies. Physical Assessment:   Vitals:   01/27/23 1054 01/27/23 1100  BP: (!) 142/90 137/87  Pulse: 95 94  Weight: 190 lb 12.8 oz (86.5 kg)   Height: 5\' 2"  (1.575 m)   Body mass index is 34.9 kg/m.       Physical Examination:   General appearance:  alert, well appearing, and in no distress  Psych: mood appropriate, normal affect  Skin: warm & dry   Cardiovascular: normal heart rate noted  Respiratory: normal respiratory effort, no distress  Abdomen: soft, non-tender   Pelvic: VULVA: normal appearing vulva with no masses, tenderness or lesions, VAGINA: normal appearing vagina with normal color and discharge, no lesions, CERVIX: normal appearing cervix without discharge or lesions  Extremities: no edema   Chaperone: Jobe Marker    Endometrial Biopsy Procedure Note  Pre-operative Diagnosis: PMB  Post-operative Diagnosis: same  Procedure Details  he risks (including infection, bleeding, pain, and uterine perforation) and benefits of the procedure were explained to the patient and Written informed consent was obtained.  Antibiotic prophylaxis against endocarditis was not indicated.   The patient was placed in the dorsal lithotomy position.  Bimanual exam showed the uterus to be in the neutral position.  A speculum inserted in the vagina, and the cervix prepped with betadine.     A single tooth tenaculum was applied to the anterior lip of the cervix for stabilization.  Os finder was used.  A Pipelle endometrial aspirator was used to sample the endometrium.  Sample was sent for pathologic examination.  Condition: Stable  Complications: None   Assessment & Plan:  1) PMB: -Reviewed potential etiology of PMB and discussed recent ultrasound -Due to borderline thickness, recommendation to proceed with EMB -Procedure completed as above -Next step pending results of pathology.   The patient was advised to call for any fever or for prolonged or severe pain or bleeding. She was advised to use OTC analgesics as needed for mild to moderate pain. She was advised to avoid vaginal intercourse for 48 hours or until the bleeding has completely stopped.    Return in about 1 year (around 01/27/2024), or if symptoms worsen or fail to  improve, for Annual.   Myna Hidalgo, DO Attending Obstetrician & Gynecologist, [facility], [facility]

## 2023-01-31 LAB — SURGICAL PATHOLOGY

## 2023-02-01 ENCOUNTER — Encounter: Payer: Self-pay | Admitting: Obstetrics & Gynecology

## 2023-02-01 ENCOUNTER — Other Ambulatory Visit (HOSPITAL_COMMUNITY): Payer: Self-pay | Admitting: Family Medicine

## 2023-02-01 DIAGNOSIS — Z1231 Encounter for screening mammogram for malignant neoplasm of breast: Secondary | ICD-10-CM

## 2023-02-02 ENCOUNTER — Telehealth: Payer: Self-pay | Admitting: Family Medicine

## 2023-02-02 NOTE — Telephone Encounter (Signed)
Labs ordered back in march are for [facility] and are correct

## 2023-02-02 NOTE — Telephone Encounter (Signed)
Called patient to schedule an appointment per provider, patient asked if nurse can send her lab order to [facility] instead of [facility].  She has a lot of going on right now trying to take care of that and will call back to schedule a followup with Dr Lodema Hong in July.    Per patient send labs order  to [facility].

## 2023-02-09 ENCOUNTER — Other Ambulatory Visit: Payer: BC Managed Care – PPO | Admitting: Obstetrics & Gynecology

## 2023-03-02 ENCOUNTER — Other Ambulatory Visit: Payer: Self-pay | Admitting: Family Medicine

## 2023-03-02 DIAGNOSIS — E559 Vitamin D deficiency, unspecified: Secondary | ICD-10-CM

## 2023-03-03 ENCOUNTER — Encounter (HOSPITAL_COMMUNITY): Payer: Self-pay

## 2023-03-03 ENCOUNTER — Inpatient Hospital Stay (HOSPITAL_COMMUNITY): Admission: RE | Admit: 2023-03-03 | Payer: BC Managed Care – PPO | Source: Ambulatory Visit

## 2023-03-03 ENCOUNTER — Ambulatory Visit (HOSPITAL_COMMUNITY)
Admission: RE | Admit: 2023-03-03 | Discharge: 2023-03-03 | Disposition: A | Discharge status: Home / Self Care | Payer: BC Managed Care – PPO | Source: Ambulatory Visit | Attending: Family Medicine | Admitting: Family Medicine

## 2023-03-03 DIAGNOSIS — Z1231 Encounter for screening mammogram for malignant neoplasm of breast: Secondary | ICD-10-CM | POA: Diagnosis not present

## 2023-03-07 ENCOUNTER — Other Ambulatory Visit (HOSPITAL_COMMUNITY): Payer: Self-pay | Admitting: Family Medicine

## 2023-03-07 DIAGNOSIS — R928 Other abnormal and inconclusive findings on diagnostic imaging of breast: Secondary | ICD-10-CM

## 2023-03-08 ENCOUNTER — Other Ambulatory Visit (HOSPITAL_COMMUNITY): Payer: Self-pay | Admitting: Family Medicine

## 2023-03-08 DIAGNOSIS — R928 Other abnormal and inconclusive findings on diagnostic imaging of breast: Secondary | ICD-10-CM

## 2023-03-14 ENCOUNTER — Encounter: Payer: Self-pay | Admitting: *Deleted

## 2023-04-04 ENCOUNTER — Ambulatory Visit (HOSPITAL_COMMUNITY)
Admission: RE | Admit: 2023-04-04 | Discharge: 2023-04-04 | Disposition: A | Discharge status: Home / Self Care | Payer: BC Managed Care – PPO | Source: Ambulatory Visit | Attending: Family Medicine | Admitting: Family Medicine

## 2023-04-04 ENCOUNTER — Encounter (HOSPITAL_COMMUNITY): Payer: Self-pay

## 2023-04-04 DIAGNOSIS — R928 Other abnormal and inconclusive findings on diagnostic imaging of breast: Secondary | ICD-10-CM | POA: Diagnosis not present

## 2023-04-04 DIAGNOSIS — R92333 Mammographic heterogeneous density, bilateral breasts: Secondary | ICD-10-CM | POA: Diagnosis not present

## 2023-04-04 DIAGNOSIS — N6001 Solitary cyst of right breast: Secondary | ICD-10-CM | POA: Diagnosis not present

## 2023-05-30 ENCOUNTER — Other Ambulatory Visit: Payer: Self-pay | Admitting: Family Medicine

## 2023-08-14 ENCOUNTER — Other Ambulatory Visit: Payer: Self-pay | Admitting: Family Medicine

## 2023-08-14 DIAGNOSIS — E559 Vitamin D deficiency, unspecified: Secondary | ICD-10-CM

## 2023-11-03 DIAGNOSIS — M79672 Pain in left foot: Secondary | ICD-10-CM | POA: Diagnosis not present

## 2023-11-03 DIAGNOSIS — M67472 Ganglion, left ankle and foot: Secondary | ICD-10-CM | POA: Diagnosis not present

## 2023-12-20 ENCOUNTER — Other Ambulatory Visit: Payer: Self-pay | Admitting: Family Medicine

## 2024-01-17 ENCOUNTER — Other Ambulatory Visit: Payer: Self-pay | Admitting: Family Medicine

## 2024-02-13 ENCOUNTER — Other Ambulatory Visit: Payer: Self-pay | Admitting: Family Medicine

## 2024-02-13 DIAGNOSIS — E559 Vitamin D deficiency, unspecified: Secondary | ICD-10-CM

## 2024-03-08 ENCOUNTER — Telehealth: Payer: Self-pay

## 2024-03-08 NOTE — Telephone Encounter (Signed)
 done

## 2024-03-08 NOTE — Telephone Encounter (Signed)
 Copied from CRM [id number]. Topic: Clinical - Request for Lab/Test Order >> Mar 08, 2024  9:57 AM Avram MATSU wrote: Reason for CRM: patient had an order that expired and would  like for it to be put in to [facility] not [facility]

## 2024-03-08 NOTE — Telephone Encounter (Signed)
 Lvm to cb. Lab orders from March are still valid and were for [facility]. Orders printed and placed up front for pick up

## 2024-03-08 NOTE — Telephone Encounter (Signed)
 Pt returned call and if you could give her a call back she would appreciate it.

## 2024-03-22 ENCOUNTER — Other Ambulatory Visit (HOSPITAL_COMMUNITY): Payer: Self-pay | Admitting: Family Medicine

## 2024-03-22 DIAGNOSIS — Z1231 Encounter for screening mammogram for malignant neoplasm of breast: Secondary | ICD-10-CM

## 2024-03-25 NOTE — Telephone Encounter (Signed)
 Patient picked up forms.

## 2024-03-29 ENCOUNTER — Telehealth: Payer: Self-pay

## 2024-03-29 DIAGNOSIS — I10 Essential (primary) hypertension: Secondary | ICD-10-CM | POA: Diagnosis not present

## 2024-03-29 DIAGNOSIS — E785 Hyperlipidemia, unspecified: Secondary | ICD-10-CM | POA: Diagnosis not present

## 2024-03-29 LAB — LIPID PANEL
Cholesterol: 222 mg/dL — ABNORMAL HIGH (ref <200)
HDL: 56 mg/dL (ref >=50)
LDL Cholesterol (Calc): 139 mg/dL — ABNORMAL HIGH
Non-HDL Cholesterol (Calc): 166 mg/dL — ABNORMAL HIGH (ref <130)
Total CHOL/HDL Ratio: 4 (calc) (ref <5.0)
Triglycerides: 142 mg/dL (ref <150)

## 2024-03-29 LAB — COMPREHENSIVE METABOLIC PANEL WITH GFR
AG Ratio: 1.6 (calc) (ref 1.0–2.5)
ALT: 27 U/L (ref 6–29)
AST: 21 U/L (ref 10–35)
Albumin: 4.7 g/dL (ref 3.6–5.1)
Alkaline phosphatase (APISO): 41 U/L (ref 37–153)
BUN: 13 mg/dL (ref 7–25)
CO2: 27 mmol/L (ref 20–32)
Calcium: 9.8 mg/dL (ref 8.6–10.4)
Chloride: 100 mmol/L (ref 98–110)
Creat: 0.65 mg/dL (ref 0.50–1.03)
Globulin: 2.9 g/dL (ref 1.9–3.7)
Glucose, Bld: 119 mg/dL — ABNORMAL HIGH (ref 65–99)
Potassium: 3.9 mmol/L (ref 3.5–5.3)
Sodium: 138 mmol/L (ref 135–146)
Total Bilirubin: 1.6 mg/dL — ABNORMAL HIGH (ref 0.2–1.2)
Total Protein: 7.6 g/dL (ref 6.1–8.1)
eGFR: 105 mL/min/1.73m2 (ref >=60)

## 2024-03-29 NOTE — Telephone Encounter (Signed)
 Copied from CRM [id number]. Topic: Clinical - Request for Lab/Test Order >> Mar 29, 2024  8:06 AM Delon HERO wrote: Reason for CRM: Janel calling from [facility] is calling regarding clarity of  lab orders placed by Dr. Antonetta - Does the provider want CMP with GFR or without GFR?  Please advise Cb- [phone]

## 2024-03-29 NOTE — Telephone Encounter (Signed)
 Janel informed that is it with GFR

## 2024-03-30 ENCOUNTER — Ambulatory Visit: Payer: Self-pay | Admitting: Family Medicine

## 2024-04-01 ENCOUNTER — Other Ambulatory Visit: Payer: Self-pay

## 2024-04-01 DIAGNOSIS — R7301 Impaired fasting glucose: Secondary | ICD-10-CM

## 2024-04-02 ENCOUNTER — Encounter: Payer: Self-pay | Admitting: Family Medicine

## 2024-04-02 ENCOUNTER — Ambulatory Visit: Payer: Self-pay | Admitting: Family Medicine

## 2024-04-02 VITALS — BP 148/94 | HR 103 | Resp 16 | Ht 62.0 in | Wt 187.1 lb

## 2024-04-02 DIAGNOSIS — Z1211 Encounter for screening for malignant neoplasm of colon: Secondary | ICD-10-CM

## 2024-04-02 DIAGNOSIS — E559 Vitamin D deficiency, unspecified: Secondary | ICD-10-CM

## 2024-04-02 DIAGNOSIS — E785 Hyperlipidemia, unspecified: Secondary | ICD-10-CM

## 2024-04-02 DIAGNOSIS — R7301 Impaired fasting glucose: Secondary | ICD-10-CM

## 2024-04-02 DIAGNOSIS — I10 Essential (primary) hypertension: Secondary | ICD-10-CM

## 2024-04-02 DIAGNOSIS — E66811 Obesity, class 1: Secondary | ICD-10-CM

## 2024-04-02 DIAGNOSIS — Z0001 Encounter for general adult medical examination with abnormal findings: Secondary | ICD-10-CM

## 2024-04-02 DIAGNOSIS — R5383 Other fatigue: Secondary | ICD-10-CM

## 2024-04-02 MED ORDER — TRIAMTERENE-HCTZ 75-50 MG PO TABS: 1.0000 | ORAL_TABLET | Freq: Every day | ORAL | 1 refills | Status: AC

## 2024-04-02 MED ORDER — ROSUVASTATIN CALCIUM 10 MG PO TABS: 10.0000 mg | ORAL_TABLET | Freq: Every day | ORAL | 5 refills | Status: DC

## 2024-04-02 MED ORDER — EZETIMIBE 10 MG PO TABS: 10.0000 mg | ORAL_TABLET | Freq: Every day | ORAL | 1 refills | Status: AC

## 2024-04-02 MED ORDER — OLMESARTAN MEDOXOMIL 20 MG PO TABS: 20.0000 mg | ORAL_TABLET | Freq: Every day | ORAL | 5 refills | Status: AC

## 2024-04-02 NOTE — Progress Notes (Deleted)
    Carrie Mcintyre     MRN: [id number]      DOB: May 20, 1971  Chief Complaint  Patient presents with   Medical Management of Chronic Issues    Follow up     HPI: Patient is in for annual physical exam. No other health concerns are expressed or addressed at the visit. Recent labs,  are reviewed. Immunization is reviewed , and  updated if needed.   PE: Pleasant  female, alert and oriented x 3, in no cardio-pulmonary distress. Afebrile. HEENT No facial trauma or asymetry. Sinuses non tender.  Extra occullar muscles intact.. External ears normal, . Neck: supple, no adenopathy,JVD or thyromegaly.No bruits.  Chest: Clear to ascultation bilaterally.No crackles or wheezes. Non tender to palpation  Breast: No asymetry,no masses or lumps. No tenderness. No nipple discharge or inversion. No axillary or supraclavicular adenopathy  Cardiovascular system; Heart sounds normal,  S1 and  S2 ,no S3.  No murmur, or thrill. Apical beat not displaced Peripheral pulses normal.  Abdomen: Soft, non tender, no organomegaly or masses. No bruits. Bowel sounds normal. No guarding, tenderness or rebound.   GU: External genitalia normal female genitalia , normal female distribution of hair. No lesions. Urethral meatus normal in size, no  Prolapse, no lesions visibly  Present. Bladder non tender. Vagina pink and moist , with no visible lesions , discharge present . Adequate pelvic support no  cystocele or rectocele noted Cervix pink and appears healthy, no lesions or ulcerations noted, no discharge noted from os Uterus normal size, no adnexal masses, no cervical motion or adnexal tenderness.   Musculoskeletal exam: Full ROM of spine, hips , shoulders and knees. No deformity ,swelling or crepitus noted. No muscle wasting or atrophy.   Neurologic: Cranial nerves 2 to 12 intact. Power, tone ,sensation and reflexes normal throughout. No disturbance in gait. No tremor.  Skin: Intact,  no ulceration, erythema , scaling or rash noted. Pigmentation normal throughout  Psych; Normal mood and affect. Judgement and concentration normal   Assessment & Plan:  No problem-specific Assessment & Plan notes found for this encounter.

## 2024-04-02 NOTE — Patient Instructions (Addendum)
 F/U mid December  Nurse visit for BP re eval last week in August ( Friday preferred)  New additional medication  started for blood pressure is olmesartan  20 mg daily, continue triamterene  as before  New additional; medication for high cholesterol is rosuvastatin , continue ezetemide, cholesterol has improved  Nurse pls add TSH and vit D level to current lab  Nurse please order cologuard test  You are being referred for evaluation of sleep apnea  CBC, fasting lipid, cmp and EGFR 3 to 5 days before December follow up  Condolence re your recent loss, allow yourself the time and space that you need  It is important that you exercise regularly at least 30 minutes 5 times a week. If you develop chest pain, have severe difficulty breathing, or feel very tired, stop exercising immediately and seek medical attention   Think about what you will eat, plan ahead. Choose  clean, green, fresh or frozen over canned, processed or packaged foods which are more sugary, salty and fatty. 70 to 75% of food eaten should be vegetables and fruit. Three meals at set times with snacks allowed between meals, but they must be fruit or vegetables. Aim to eat over a 12 hour period , example 7 am to 7 pm, and STOP after  your last meal of the day. Drink water,generally about 64 ounces per day, no other drink is as healthy. Fruit juice is best enjoyed in a healthy way, by EATING the fruit. Thanks for choosing [facility], we consider it a privelige to serve you.  Pls consider heart test we discussed

## 2024-04-05 ENCOUNTER — Ambulatory Visit (HOSPITAL_COMMUNITY)
Admission: RE | Admit: 2024-04-05 | Discharge: 2024-04-05 | Disposition: A | Discharge status: Home / Self Care | Source: Ambulatory Visit

## 2024-04-05 ENCOUNTER — Encounter (HOSPITAL_COMMUNITY): Payer: Self-pay

## 2024-04-05 DIAGNOSIS — Z1231 Encounter for screening mammogram for malignant neoplasm of breast: Secondary | ICD-10-CM | POA: Insufficient documentation

## 2024-04-07 ENCOUNTER — Encounter: Payer: Self-pay | Admitting: Family Medicine

## 2024-04-07 DIAGNOSIS — R5383 Other fatigue: Secondary | ICD-10-CM | POA: Insufficient documentation

## 2024-04-07 DIAGNOSIS — Z1211 Encounter for screening for malignant neoplasm of colon: Secondary | ICD-10-CM | POA: Insufficient documentation

## 2024-04-07 DIAGNOSIS — R7301 Impaired fasting glucose: Secondary | ICD-10-CM | POA: Insufficient documentation

## 2024-04-07 DIAGNOSIS — Z0001 Encounter for general adult medical examination with abnormal findings: Secondary | ICD-10-CM | POA: Insufficient documentation

## 2024-04-07 NOTE — Assessment & Plan Note (Signed)
  Patient re-educated about  the importance of commitment to a  minimum of 150 minutes of exercise per week as able.  The importance of healthy food choices with portion control discussed, as well as eating regularly and within a 12 hour window most days. The need to choose clean , green food 50 to 75% of the time is discussed, as well as to make water the primary drink and set a goal of 64 ounces water daily.       04/02/2024    3:41 PM 01/27/2023   10:54 AM 12/13/2022    2:18 PM  Weight /BMI  Weight 187 lb 1.9 oz 190 lb 12.8 oz 191 lb 8 oz  Height 5' 2 (1.575 m) 5' 2 (1.575 m) 5' 2 (1.575 m)  BMI 34.22 kg/m2 34.9 kg/m2 35.03 kg/m2

## 2024-04-07 NOTE — Assessment & Plan Note (Signed)
 Hyperlipidemia:Low fat diet discussed and encouraged.   Lipid Panel  Lab Results  Component Value Date   CHOL 222 (H) 03/29/2024   HDL 56 03/29/2024   LDLCALC 139 (H) 03/29/2024   TRIG 142 03/29/2024   CHOLHDL 4.0 03/29/2024     Start crestor 

## 2024-04-07 NOTE — Assessment & Plan Note (Signed)
 Fatigue with snoring n, and thick neck also uncontrolled HTN, needs sleep eval refer

## 2024-04-07 NOTE — Assessment & Plan Note (Signed)
 Updated lab needed at/ before next visit.

## 2024-04-07 NOTE — Progress Notes (Signed)
 Carrie Mcintyre     MRN: [id number]      DOB: [birth year]  Chief Complaint  Patient presents with   Annual Exam    HPI: Patient is in for annual physical exam. No other health concerns are expressed or addressed at the visit. Recent labs,  are reviewed., and med adjustments made Currently grieving loss of Dad approx 8 months ago, her one sister is supportive , no interest in hterapy and no overt need Immunization is reviewed , and  updated if needed.   PE: BP (!) 148/94   Pulse (!) 103   Resp 16   Ht 5' 2 (1.575 m)   Wt 187 lb 1.9 oz (84.9 kg)   LMP 05/27/2018 (Approximate)   SpO2 96%   BMI 34.22 kg/m   Pleasant  female, alert and oriented x 3, in no cardio-pulmonary distress. Afebrile. HEENT No facial trauma or asymetry. Sinuses non tender.  Extra occullar muscles intact.. External ears normal, . Neck: supple, no adenopathy,JVD or thyromegaly.No bruits.  Chest: Clear to ascultation bilaterally.No crackles or wheezes. Non tender to palpation  Breast: Asymptomatic, normal mammogram UTD, not examined  Cardiovascular system; Heart sounds normal,  S1 and  S2 ,no S3.  No murmur, or thrill. Peripheral pulses normal.  Abdomen: Soft, non tender  GU: Asymptomatic, not examined, pap is up to date  Musculoskeletal exam: Full ROM of spine, hips , shoulders and knees. No deformity ,swelling or crepitus noted. No muscle wasting or atrophy.   Neurologic: Cranial nerves 2 to 12 intact. Power, tone ,sensation  normal throughout. No disturbance in gait. No tremor.  Skin: Intact, no ulceration, erythema , scaling or rash noted. t  Psych; Normal mood and affect. Judgement and concentration normal Tearful at times speaking of loss of Dad   Assessment & Plan:  Annual visit for general adult medical examination with abnormal findings Annual exam as documented. Counseling done  re healthy lifestyle involving commitment to 150 minutes exercise per week, heart  healthy diet, and attaining healthy weight.The importance of adequate sleep also discussed.  Immunization and cancer screening needs are specifically addressed at this visit.   Essential hypertension Uncontrolled, add olmesartan  20 mg daily F/u nirse BP check in 4 weeks DASH diet and commitment to daily physical activity for a minimum of 30 minutes discussed and encouraged, as a part of hypertension management. The importance of attaining a healthy weight is also discussed.     04/02/2024    4:35 PM 04/02/2024    3:41 PM 01/27/2023   11:00 AM 01/27/2023   10:54 AM 12/13/2022    2:18 PM 11/24/2022    1:39 PM 11/24/2022    1:07 PM  BP/Weight  Systolic BP 148 152 137 142 154 148 160  Diastolic BP 94 95 87 90 99 92 897  Wt. (Lbs)  187.12  190.8 191.5    BMI  34.22 kg/m2  34.9 kg/m2 35.03 kg/m2         Hyperlipidemia LDL goal <100 Hyperlipidemia:Low fat diet discussed and encouraged.   Lipid Panel  Lab Results  Component Value Date   CHOL 222 (H) 03/29/2024   HDL 56 03/29/2024   LDLCALC 139 (H) 03/29/2024   TRIG 142 03/29/2024   CHOLHDL 4.0 03/29/2024     Start crestor   Obesity (BMI 30.0-34.9)  Patient re-educated about  the importance of commitment to a  minimum of 150 minutes of exercise per week as able.  The importance of healthy food choices with  portion control discussed, as well as eating regularly and within a 12 hour window most days. The need to choose clean , green food 50 to 75% of the time is discussed, as well as to make water the primary drink and set a goal of 64 ounces water daily.       04/02/2024    3:41 PM 01/27/2023   10:54 AM 12/13/2022    2:18 PM  Weight /BMI  Weight 187 lb 1.9 oz 190 lb 12.8 oz 191 lb 8 oz  Height 5' 2 (1.575 m) 5' 2 (1.575 m) 5' 2 (1.575 m)  BMI 34.22 kg/m2 34.9 kg/m2 35.03 kg/m2      Fatigue Fatigue with snoring n, and thick neck also uncontrolled HTN, needs sleep eval refer  IFG (impaired fasting glucose) Updated  lab needed at/ before next visit.

## 2024-04-07 NOTE — Assessment & Plan Note (Signed)
 Annual exam as documented. Counseling done  re healthy lifestyle involving commitment to 150 minutes exercise per week, heart healthy diet, and attaining healthy weight.The importance of adequate sleep also discussed.  Immunization and cancer screening needs are specifically addressed at this visit.

## 2024-04-07 NOTE — Assessment & Plan Note (Signed)
 Uncontrolled, add olmesartan  20 mg daily F/u nirse BP check in 4 weeks DASH diet and commitment to daily physical activity for a minimum of 30 minutes discussed and encouraged, as a part of hypertension management. The importance of attaining a healthy weight is also discussed.     04/02/2024    4:35 PM 04/02/2024    3:41 PM 01/27/2023   11:00 AM 01/27/2023   10:54 AM 12/13/2022    2:18 PM 11/24/2022    1:39 PM 11/24/2022    1:07 PM  BP/Weight  Systolic BP 148 152 137 142 154 148 160  Diastolic BP 94 95 87 90 99 92 897  Wt. (Lbs)  187.12  190.8 191.5    BMI  34.22 kg/m2  34.9 kg/m2 35.03 kg/m2

## 2024-04-08 ENCOUNTER — Ambulatory Visit: Payer: Self-pay | Admitting: Family Medicine

## 2024-04-08 LAB — BAYER DCA HB A1C WAIVED: HB A1C (BAYER DCA - WAIVED): 5.2 % (ref 4.8–5.6)

## 2024-04-08 LAB — HEMOGLOBIN A1C: A1c: 5.2

## 2024-04-18 DIAGNOSIS — D225 Melanocytic nevi of trunk: Secondary | ICD-10-CM | POA: Diagnosis not present

## 2024-04-18 DIAGNOSIS — Z1283 Encounter for screening for malignant neoplasm of skin: Secondary | ICD-10-CM | POA: Diagnosis not present

## 2024-05-15 NOTE — Progress Notes (Unsigned)
 New Patient Pulmonology Office Visit   Subjective:  Patient ID: Carrie Mcintyre, female    DOB: [birth year]  MRN: [id number]  Referred by: Antonetta Rollene BRAVO, MD  CC: No chief complaint on file.   HPI Carrie Mcintyre is a 53 y.o. female with PMH significant for HLD and HTN who presents for initial evaluation of sleep disordered breathing.  Sleep history The patient goes to bed at *** PM and falls asleep within *** minutes. They have *** awakenings at night due to ***. They do/do not fall asleep easily after returning to bed. The patient does/does not report snoring. The patient does/does not report witnessed apneas. The patient does/does not report morning headaches or dry mouth. The patient does/does not complain of excessive daytime sleepiness. The patient does/does not take naps. The patient gets out of bed at ***AM. The patient does/does not drink *** cups of coffee per day.   The Epworth Sleepiness score is ***/24.   STOP BANG: [ ]  Snoring - do you snore loudly? [ ]  Tired - do you feel tired, fatigued, or sleepy during daytime? [ ]  Observed apneas? [ ]  Pressure - hx of high blood pressure? [ ]  BMI? [ ]  Age > 50? [ ]  Neck circumference: > 16 inch or 40 cm collar? [ ]  Female Gender  Total - ***/8 (>2 moderate risk, >4 high risk)   {PULM QUESTIONNAIRES (Optional):33196}  ROS  Allergies: Patient has no known allergies.  Current Outpatient Medications:    ezetimibe  (ZETIA ) 10 MG tablet, Take 1 tablet (10 mg total) by mouth daily., Disp: 90 tablet, Rfl: 1   olmesartan  (BENICAR ) 20 MG tablet, Take 1 tablet (20 mg total) by mouth daily., Disp: 30 tablet, Rfl: 5   rosuvastatin  (CRESTOR ) 10 MG tablet, Take 1 tablet (10 mg total) by mouth daily., Disp: 30 tablet, Rfl: 5   triamterene -hydrochlorothiazide (MAXZIDE) 75-50 MG tablet, Take 1 tablet by mouth daily., Disp: 90 tablet, Rfl: 1   Vitamin D , Ergocalciferol , (DRISDOL ) 1.25 MG (50000 UNIT) CAPS capsule, TAKE 1 CAPSULE BY MOUTH EVERY  7 DAYS, Disp: 12 capsule, Rfl: 1 Past Medical History:  Diagnosis Date   ABNORMAL ELECTROCARDIOGRAM 03/04/2010   Qualifier: Diagnosis of  By: Antonetta MD, Margaret     Allergic rhinitis 04/16/2016   FH: skin cancer 03-05-12   Mom died of malignant melanoma age 22 in 09/2010    Fracture    Hyperlipidemia    Hypertension    Metabolic syndrome X 02/23/2013   Past Surgical History:  Procedure Laterality Date   Bakers cyst removed in childhood     Family History  Problem Relation Age of Onset   Stroke Paternal Grandfather    Diabetes Paternal Grandmother    Dementia Maternal Grandmother    Dementia Father    Hypertension Father    Cancer Mother        melanoma   Social History   Socioeconomic History   Marital status: Single    Spouse name: Not on file   Number of children: Not on file   Years of education: Not on file   Highest education level: Not on file  Occupational History   Not on file  Tobacco Use   Smoking status: Never   Smokeless tobacco: Never  Vaping Use   Vaping status: Never Used  Substance and Sexual Activity   Alcohol use: Yes    Comment: wine occasionally   Drug use: No   Sexual activity: Yes    Birth  control/protection: Post-menopausal  Other Topics Concern   Not on file  Social History Narrative   Not on file   Social Drivers of Health   Financial Resource Strain: Low Risk  (12/13/2022)   Overall Financial Resource Strain (CARDIA)    Difficulty of Paying Living Expenses: Not hard at all  Food Insecurity: No Food Insecurity (12/13/2022)   Hunger Vital Sign    Worried About Running Out of Food in the Last Year: Never true    Ran Out of Food in the Last Year: Never true  Transportation Needs: No Transportation Needs (12/13/2022)   PRAPARE - Administrator, Civil Service (Medical): No    Lack of Transportation (Non-Medical): No  Physical Activity: Insufficiently Active (12/13/2022)   Exercise Vital Sign    Days of Exercise per Week: 1 day     Minutes of Exercise per Session: 60 min  Stress: No Stress Concern Present (12/13/2022)   [employer] of Occupational Health - Occupational Stress Questionnaire    Feeling of Stress : Only a little  Social Connections: Moderately Isolated (12/13/2022)   Social Connection and Isolation Panel    Frequency of Communication with Friends and Family: More than three times a week    Frequency of Social Gatherings with Friends and Family: More than three times a week    Attends Religious Services: More than 4 times per year    Active Member of [employer] or Organizations: No    Attends Banker Meetings: Never    Marital Status: Never married  Intimate Partner Violence: Not At Risk (12/13/2022)   Humiliation, Afraid, Rape, and Kick questionnaire    Fear of Current or Ex-Partner: No    Emotionally Abused: No    Physically Abused: No    Sexually Abused: No       Objective:  LMP 05/27/2018 (Approximate)  {Pulm Vitals (Optional):32837}  Physical Exam  Diagnostic Review:  {Labs (Optional):32838}     Assessment & Plan:   Assessment & Plan   No orders of the defined types were placed in this encounter.     No follow-ups on file.   Patrich Heinze, MD

## 2024-05-16 ENCOUNTER — Encounter: Payer: Self-pay | Admitting: Pulmonary Disease

## 2024-05-16 ENCOUNTER — Ambulatory Visit: Admitting: Pulmonary Disease

## 2024-05-16 VITALS — BP 135/89 | HR 106 | Ht 62.0 in | Wt 191.8 lb

## 2024-05-16 DIAGNOSIS — R0683 Snoring: Secondary | ICD-10-CM

## 2024-05-16 DIAGNOSIS — Z23 Encounter for immunization: Secondary | ICD-10-CM

## 2024-05-16 DIAGNOSIS — E66811 Obesity, class 1: Secondary | ICD-10-CM | POA: Diagnosis not present

## 2024-05-16 DIAGNOSIS — Z6835 Body mass index (BMI) 35.0-35.9, adult: Secondary | ICD-10-CM | POA: Diagnosis not present

## 2024-05-16 NOTE — Patient Instructions (Signed)
-   Please work on weight loss - I agree with you that you don't need testing at this time

## 2024-06-24 ENCOUNTER — Ambulatory Visit: Payer: Self-pay

## 2024-06-24 VITALS — BP 124/84

## 2024-06-24 DIAGNOSIS — I10 Essential (primary) hypertension: Secondary | ICD-10-CM

## 2024-06-24 NOTE — Progress Notes (Signed)
 Patient is in office today for a nurse visit for Blood Pressure Check. Patient blood pressure was 130/90, Patient No chest pain, No shortness of breath, No dyspnea on exertion, No orthopnea, No paroxysmal nocturnal dyspnea, No edema, No palpitations, No syncope  SECOND BP READING ANNUALLY WAS 124/84

## 2024-06-25 ENCOUNTER — Ambulatory Visit: Payer: Self-pay

## 2024-07-05 ENCOUNTER — Ambulatory Visit
Admission: EM | Admit: 2024-07-05 | Discharge: 2024-07-05 | Disposition: A | Discharge status: Home / Self Care | Attending: Nurse Practitioner | Admitting: Nurse Practitioner

## 2024-07-05 DIAGNOSIS — R238 Other skin changes: Secondary | ICD-10-CM

## 2024-07-05 MED ORDER — CEPHALEXIN 500 MG PO CAPS: 500.0000 mg | ORAL_CAPSULE | Freq: Four times a day (QID) | ORAL | 0 refills | Status: AC

## 2024-07-05 NOTE — ED Provider Notes (Signed)
 [facility]    CSN: [id number] Arrival date & time: 07/05/24  1416      History   Chief Complaint No chief complaint on file.   HPI Carrie Mcintyre is a 53 y.o. female.   The history is provided by the patient.   Patient presents for complaints of blisters to the left foot.  Patient states that she purchased some new shoes, and states that she wore them all day, and on that particular day she did a lot of walking.  Subsequently thereafter, she developed blistering to all toes on the left foot.  Blisters are worse on the left great toe and on the left fourth toe.  She denies fever, chills, purulent drainage, numbness, tingling, radiation of pain, or the inability to bear weight.  Patient denies history of diabetes.  Past Medical History:  Diagnosis Date   ABNORMAL ELECTROCARDIOGRAM 03/04/2010   Qualifier: Diagnosis of  By: Antonetta MD, Margaret     Allergic rhinitis 04/16/2016   FH: skin cancer 03-12-12   Mom died of malignant melanoma age 56 in 09/2010    Fracture    Hyperlipidemia    Hypertension    Metabolic syndrome X 02/23/2013    Patient Active Problem List   Diagnosis Date Noted   Annual visit for general adult medical examination with abnormal findings 04/07/2024   Fatigue 04/07/2024   IFG (impaired fasting glucose) 04/07/2024   Screening for colon cancer 04/07/2024   Post-menopausal bleeding 11/24/2022   Laceration of left thumb without foreign body without damage to nail 05/20/2020   Visit for suture removal 05/20/2020   Need for immunization against influenza 06/22/2018   Vitamin D  deficiency 03/03/2010   Hyperlipidemia LDL goal <100 05/19/2009   Obesity (BMI 30.0-34.9) 05/19/2009   Essential hypertension 05/19/2009    Past Surgical History:  Procedure Laterality Date   Bakers cyst removed in childhood      OB History     Gravida  0   Para  0   Term  0   Preterm  0   AB  0   Living  0      SAB  0   IAB  0   Ectopic  0    Multiple  0   Live Births  0            Home Medications    Prior to Admission medications   Medication Sig Start Date End Date Taking? Authorizing Provider  cephALEXin (KEFLEX) 500 MG capsule Take 1 capsule (500 mg total) by mouth 4 (four) times daily for 5 days. 07/05/24 07/10/24 Yes Leath-Warren, Etta PARAS, NP  ezetimibe  (ZETIA ) 10 MG tablet Take 1 tablet (10 mg total) by mouth daily. 04/02/24   Antonetta Rollene BRAVO, MD  olmesartan  (BENICAR ) 20 MG tablet Take 1 tablet (20 mg total) by mouth daily. 04/02/24   Antonetta Rollene BRAVO, MD  rosuvastatin  (CRESTOR ) 10 MG tablet Take 1 tablet (10 mg total) by mouth daily. 04/02/24   Antonetta Rollene BRAVO, MD  triamterene -hydrochlorothiazide (MAXZIDE) 75-50 MG tablet Take 1 tablet by mouth daily. 04/02/24   Antonetta Rollene BRAVO, MD  Vitamin D , Ergocalciferol , (DRISDOL ) 1.25 MG (50000 UNIT) CAPS capsule TAKE 1 CAPSULE BY MOUTH EVERY 7 DAYS 08/15/23   Antonetta Rollene BRAVO, MD    Family History Family History  Problem Relation Age of Onset   Stroke Paternal Grandfather    Diabetes Paternal Grandmother    Dementia Maternal Grandmother    Dementia Father  Hypertension Father    Cancer Mother        melanoma    Social History Social History   Tobacco Use   Smoking status: Never   Smokeless tobacco: Never  Vaping Use   Vaping status: Never Used  Substance Use Topics   Alcohol use: Yes    Comment: wine occasionally   Drug use: No     Allergies   Patient has no known allergies.   Review of Systems Review of Systems Per HPI  Physical Exam Triage Vital Signs ED Triage Vitals  Encounter Vitals Group     BP 07/05/24 1431 130/81     Girls Systolic BP Percentile --      Girls Diastolic BP Percentile --      Boys Systolic BP Percentile --      Boys Diastolic BP Percentile --      Pulse Rate 07/05/24 1431 (!) 105     Resp 07/05/24 1431 16     Temp 07/05/24 1431 98.5 F (36.9 C)     Temp Source 07/05/24 1431 Oral     SpO2  07/05/24 1431 95 %     Weight --      Height --      Head Circumference --      Peak Flow --      Pain Score 07/05/24 1432 0     Pain Loc --      Pain Education --      Exclude from Growth Chart --    No data found.  Updated Vital Signs BP 130/81 (BP Location: Right Arm)   Pulse (!) 105   Temp 98.5 F (36.9 C) (Oral)   Resp 16   LMP 05/27/2018 (Approximate)   SpO2 95%   Visual Acuity Right Eye Distance:   Left Eye Distance:   Bilateral Distance:    Right Eye Near:   Left Eye Near:    Bilateral Near:     Physical Exam Vitals and nursing note reviewed.  Constitutional:      General: She is not in acute distress.    Appearance: Normal appearance.  HENT:     Head: Normocephalic.  Eyes:     Extraocular Movements: Extraocular movements intact.     Pupils: Pupils are equal, round, and reactive to light.  Pulmonary:     Effort: Pulmonary effort is normal.  Musculoskeletal:     Cervical back: Normal range of motion.  Feet:     Left foot:     Skin integrity: Blister (Fluid-filled blisters noted to the dorsal aspect of the toes on the left foot.  Larger blisters are noted on the left great toe and left fourth toe.) and dry skin present.     Toenail Condition: Left toenails are normal.  Skin:    General: Skin is warm and dry.  Neurological:     General: No focal deficit present.     Mental Status: She is alert and oriented to person, place, and time.  Psychiatric:        Mood and Affect: Mood normal.        Behavior: Behavior normal.      UC Treatments / Results  Labs (all labs ordered are listed, but only abnormal results are displayed) Labs Reviewed - No data to display  EKG   Radiology No results found.  Procedures Procedures (including critical care time)  Medications Ordered in UC Medications - No data to display  Initial Impression / Assessment and  Plan / UC Course  I have reviewed the triage vital signs and the nursing notes.  Pertinent labs  & imaging results that were available during my care of the patient were reviewed by me and considered in my medical decision making (see chart for details).  Patient with blistering noted to all toes on the left foot.  Fluid-filled blisters were relieved with expulsion of fluid using a number 25-gauge needle to the left great toe and left fourth toe.  Sites were cleaned and dressings were applied.  Will start patient on Keflex 500 mg 4 times daily for the next 5 days to prevent infection.  Supportive care recommendations were provided discussed with the patient to include over-the-counter analgesics, keeping the feet clean and dry, and to wear comfortable, appropriate fitting shoes.  Discussed indications with patient regarding follow-up.  Patient was in agreement with this plan of care and verbalizes understanding.  All questions were answered.  Patient stable for discharge.  Final Clinical Impressions(s) / UC Diagnoses   Final diagnoses:  Blisters of multiple sites     Discharge Instructions      Take medication as prescribed. You may take over-the-counter Tylenol as needed for pain, fever, or general discomfort. Keep the feet clean and dry.  Recommend cleansing the feet daily with an antibacterial soap such as Dial gold bar soap. Recommend wearing sandals with open toes for the next several days until symptoms improved. Make sure you are wearing shoes that are appropriate fitting and with good support. Do not pick or disrupt the blisters on the toes. Seek care if you develop increased redness, swelling, foul-smelling drainage from the site, or other concerns. Follow-up as needed.     ED Prescriptions     Medication Sig Dispense Auth. Provider   cephALEXin (KEFLEX) 500 MG capsule Take 1 capsule (500 mg total) by mouth 4 (four) times daily for 5 days. 20 capsule Leath-Warren, Etta PARAS, NP      PDMP not reviewed this encounter.   Gilmer Etta PARAS, NP 07/05/24 1510

## 2024-07-05 NOTE — ED Triage Notes (Signed)
 Pt reports she has a blister on her left great toe after wearing new shoes for a day x 6 days ago Soaked in epsom salt

## 2024-07-05 NOTE — Discharge Instructions (Signed)
 Take medication as prescribed. You may take over-the-counter Tylenol as needed for pain, fever, or general discomfort. Keep the feet clean and dry.  Recommend cleansing the feet daily with an antibacterial soap such as Dial gold bar soap. Recommend wearing sandals with open toes for the next several days until symptoms improved. Make sure you are wearing shoes that are appropriate fitting and with good support. Do not pick or disrupt the blisters on the toes. Seek care if you develop increased redness, swelling, foul-smelling drainage from the site, or other concerns. Follow-up as needed.

## 2024-09-23 LAB — COLOGUARD: COLOGUARD: NEGATIVE

## 2024-09-25 ENCOUNTER — Telehealth: Payer: Self-pay | Admitting: Family Medicine

## 2024-09-25 NOTE — Telephone Encounter (Signed)
 Per patient needs her labs sent to [facility]

## 2024-09-25 NOTE — Telephone Encounter (Signed)
Already in there

## 2024-10-09 ENCOUNTER — Other Ambulatory Visit: Payer: Self-pay | Admitting: Family Medicine

## 2024-12-03 ENCOUNTER — Ambulatory Visit: Payer: Self-pay | Admitting: Family Medicine
